# Patient Record
Sex: Female | Born: 1990 | Race: White | Hispanic: No | Marital: Single | Smoking: Never smoker
Health system: Southern US, Community
[De-identification: ages and names within clinical notes are randomized; demographics above are authoritative.]

## PROBLEM LIST (undated history)

## (undated) DIAGNOSIS — S022XXA Fracture of nasal bones, initial encounter for closed fracture: Secondary | ICD-10-CM

## (undated) DIAGNOSIS — J309 Allergic rhinitis, unspecified: Secondary | ICD-10-CM

## (undated) DIAGNOSIS — L709 Acne, unspecified: Secondary | ICD-10-CM

## (undated) DIAGNOSIS — E282 Polycystic ovarian syndrome: Secondary | ICD-10-CM

## (undated) DIAGNOSIS — R519 Headache, unspecified: Secondary | ICD-10-CM

## (undated) DIAGNOSIS — H40009 Preglaucoma, unspecified, unspecified eye: Secondary | ICD-10-CM

## (undated) DIAGNOSIS — H40003 Preglaucoma, unspecified, bilateral: Secondary | ICD-10-CM

## (undated) DIAGNOSIS — R51 Headache: Secondary | ICD-10-CM

## (undated) DIAGNOSIS — J342 Deviated nasal septum: Secondary | ICD-10-CM

## (undated) HISTORY — DX: Headache: R51

## (undated) HISTORY — DX: Allergic rhinitis, unspecified: J30.9

## (undated) HISTORY — DX: Preglaucoma, unspecified, bilateral: H40.003

## (undated) HISTORY — DX: Preglaucoma, unspecified, unspecified eye: H40.009

## (undated) HISTORY — DX: Acne, unspecified: L70.9

## (undated) HISTORY — DX: Polycystic ovarian syndrome: E28.2

## (undated) HISTORY — DX: Deviated nasal septum: J34.2

## (undated) HISTORY — DX: Fracture of nasal bones, initial encounter for closed fracture: S02.2XXA

## (undated) HISTORY — DX: Headache, unspecified: R51.9

---
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
## 2013-02-03 ENCOUNTER — Ambulatory Visit (INDEPENDENT_AMBULATORY_CARE_PROVIDER_SITE_OTHER): Payer: PRIVATE HEALTH INSURANCE | Admitting: Pediatrics

## 2013-02-03 VITALS — Wt 163.9 lb

## 2013-02-03 DIAGNOSIS — R51 Headache: Secondary | ICD-10-CM

## 2013-02-03 DIAGNOSIS — J309 Allergic rhinitis, unspecified: Secondary | ICD-10-CM | POA: Insufficient documentation

## 2013-02-03 DIAGNOSIS — H6593 Unspecified nonsuppurative otitis media, bilateral: Secondary | ICD-10-CM

## 2013-02-03 DIAGNOSIS — H6590 Unspecified nonsuppurative otitis media, unspecified ear: Secondary | ICD-10-CM | POA: Insufficient documentation

## 2013-02-03 MED ORDER — IBUPROFEN 200 MG PO TABS: 600.0000 mg | ORAL_TABLET | Freq: Three times a day (TID) | ORAL | Status: DC | PRN

## 2013-02-03 MED ORDER — CETIRIZINE HCL 10 MG PO TABS: 10.0000 mg | ORAL_TABLET | Freq: Every day | ORAL | Status: DC

## 2013-02-03 MED ORDER — PSEUDOEPHEDRINE-GUAIFENESIN ER 120-1200 MG PO TB12: 1.0000 | ORAL_TABLET | ORAL | Status: AC

## 2013-02-03 MED ORDER — FLUTICASONE PROPIONATE 50 MCG/ACT NA SUSP: NASAL | Status: DC

## 2013-02-03 NOTE — Progress Notes (Signed)
Subjective:    History was provided by the patient. Karen Lang is a 22 y.o. female who presents for evaluation of sore throat. Symptoms began 1 week ago. Pain is mild-moderate and localized. Fever is absent. Other associated symptoms have included nasal congestion, post-nasal drainage & throat clearing. Fluid intake is good. There has not been contact with an individual with known strep. Current medications include Sudafed, Zyrtec.   The following portions of the patient's history were reviewed and updated as appropriate: allergies and current medications.   Pertinent PMH Allergic rhinitis  Review of Systems  General: negative for fevers or change in activity level ENT: negative for earaches but + fullness in both ears  GI: negative for abd pain, constipation, diarrhea and vomiting.  Derm: no rashes   Objective:   Wt 163 lb 14.4 oz (74.345 kg)  BMI 26.85 kg/m2  General:  alert and cooperative, no distress   HEENT:  Normocephalic Sclera/conjunctiva clear bilaterally, no drainage Right & Left TMs pearly gray without infection, but + mucoid fluid Nasal mucosa pink, turbinates very swollen Moist, pink oral mucus membranes;  Pharynx erythematous without exudate or lesions;  Tonsils 1+, no exudate  Neck:   supple, symmetrical, trachea midline  mild anterior cervical adenopathy  Lungs:  clear to auscultation bilaterally   Heart:  regular rate and rhythm, S1, S2 normal, no murmur, click, rub or gallop   Abdomen:  soft, non-tender, non-distended, active bowel sounds, no masses   Skin:  reveals no rash     Assessment:    Allergic rhinitis Sinus headache Bilateral OME Pharyngitis, secondary to post-nasal drainage.   Plan:    Discussed pathophysiology of allergies, headaches and mucoid OME. Discussed treatment, expected course of illness and the importance of avoiding unnecessary antibiotics. Supportive care: OTC analgesics, salt water gargles.  Saline nasal spray for nasal  congestion PRN Rx: Flonase BID x2 days, then QHS;  Mucinex D QAM x3 days; Zyrtec 10mg  daily 600mg  Ibuprofen Q8hr PRN Follow up as needed.  Will call pt if DNA probe +.

## 2013-02-03 NOTE — Patient Instructions (Signed)
Follow-up if symptoms worsen or don't improve in 2 days.  Headache and Allergies The relationship between allergies and headaches is unclear. Many people with allergic or infectious nasal problems also have headaches (migraines or sinus headaches). However, sometimes allergies can cause pressure that feels like a headache, and sometimes headaches can cause allergy-like symptoms. It is not always clear whether your symptoms are caused by allergies or by a headache. CAUSES   Migraine: The cause of a migraine is not always known.  Sinus Headache: The cause of a sinus headache may be a sinus infection. Other conditions that may be related to sinus headaches include:  Hay fever (allergic rhinitis).  Deviation of the nasal septum.  Swelling or clogging of the nasal passages. SYMPTOMS  Migraine headache symptoms (which often last 4 to 72 hours) include:  Intense, throbbing pain on one or both sides of the head.  Nausea.  Vomiting.  Being extra sensitive to light.  Being extra sensitive to sound.  Nervous system reactions that appear similar to an allergic reaction:  Stuffy nose.  Runny nose.  Tearing. Sinus headaches are felt as facial pain or pressure.  DIAGNOSIS  Because there is some overlap in symptoms, sinus and migraine headaches are often misdiagnosed. For example, a person with migraines may also feel facial pressure. Likewise, many people with hay fever may get migraine headaches rather than sinus headaches. These migraines can be triggered by the histamine release during an allergic reaction. An antihistamine medicine can eliminate this pain. There are standard criteria that help clarify the difference between these headaches and related allergy or allergy-like symptoms. Your caregiver can use these criteria to determine the proper diagnosis and provide you the best care. TREATMENT  Migraine medicine may help people who have persistent migraine headaches even though their  hay fever is controlled. For some people, anti-inflammatory treatments do not work to relieve migraines. Medicines called triptans (such as sumatriptan) can be helpful for those people. Document Released: 11/25/2003 Document Revised: 11/27/2011 Document Reviewed: 12/17/2009 [employer] Patient Information 2013 [place], [place].  Allergic Rhinitis Allergic rhinitis is when the mucous membranes in the nose respond to allergens. Allergens are particles in the air that cause your body to have an allergic reaction. This causes you to release allergic antibodies. Through a chain of events, these eventually cause you to release histamine into the blood stream (hence the use of antihistamines). Although meant to be protective to the body, it is this release that causes your discomfort, such as frequent sneezing, congestion and an itchy runny nose.  CAUSES  The pollen allergens may come from grasses, trees, and weeds. This is seasonal allergic rhinitis, or "hay fever." Other allergens cause year-round allergic rhinitis (perennial allergic rhinitis) such as house dust mite allergen, pet dander and mold spores.  SYMPTOMS   Nasal stuffiness (congestion).  Runny, itchy nose with sneezing and tearing of the eyes.  There is often an itching of the mouth, eyes and ears. It cannot be cured, but it can be controlled with medications. DIAGNOSIS  If you are unable to determine the offending allergen, skin or blood testing may find it. TREATMENT   Avoid the allergen.  Medications and allergy shots (immunotherapy) can help.  Hay fever may often be treated with antihistamines in pill or nasal spray forms. Antihistamines block the effects of histamine. There are over-the-counter medicines that may help with nasal congestion and swelling around the eyes. Check with your caregiver before taking or giving this medicine. If the treatment above  does not work, there are many new medications your caregiver can prescribe.  Stronger medications may be used if initial measures are ineffective. Desensitizing injections can be used if medications and avoidance fails. Desensitization is when a patient is given ongoing shots until the body becomes less sensitive to the allergen. Make sure you follow up with your caregiver if problems continue. SEEK MEDICAL CARE IF:   You develop fever (more than 100.5 F (38.1 C).  You develop a cough that does not stop easily (persistent).  You have shortness of breath.  You start wheezing.  Symptoms interfere with normal daily activities. Document Released: 05/30/2001 Document Revised: 11/27/2011 Document Reviewed: 12/09/2008 [facility] Patient Information 2013 [place], [place].

## 2013-02-04 ENCOUNTER — Telehealth: Payer: Self-pay | Admitting: Pediatrics

## 2013-02-04 MED ORDER — AMOXICILLIN 500 MG PO CAPS: 500.0000 mg | ORAL_CAPSULE | Freq: Two times a day (BID) | ORAL | Status: DC

## 2013-02-04 NOTE — Telephone Encounter (Signed)
Sister states Karen Lang is not improving,only got 4 hrs sleep last pm,pain has increased.Erin told them to call back today if not better because they are going out of town early tomorrow

## 2013-02-04 NOTE — Telephone Encounter (Signed)
Spoke to sister--advised to start benadryl and will start on amoxil

## 2013-02-12 ENCOUNTER — Encounter: Payer: Self-pay | Admitting: Internal Medicine

## 2013-02-12 ENCOUNTER — Ambulatory Visit (INDEPENDENT_AMBULATORY_CARE_PROVIDER_SITE_OTHER): Payer: Commercial Managed Care - PPO | Admitting: Internal Medicine

## 2013-02-12 VITALS — BP 118/82 | HR 86 | Temp 98.1°F | Ht 66.0 in | Wt 163.0 lb

## 2013-02-12 DIAGNOSIS — D649 Anemia, unspecified: Secondary | ICD-10-CM

## 2013-02-12 DIAGNOSIS — Z111 Encounter for screening for respiratory tuberculosis: Secondary | ICD-10-CM

## 2013-02-12 DIAGNOSIS — Z8349 Family history of other endocrine, nutritional and metabolic diseases: Secondary | ICD-10-CM

## 2013-02-12 DIAGNOSIS — Z68.41 Body mass index (BMI) pediatric, 85th percentile to less than 95th percentile for age: Secondary | ICD-10-CM

## 2013-02-12 LAB — FERRITIN: Ferritin: 5 ng/mL — ABNORMAL LOW (ref 10.0–291.0)

## 2013-02-12 LAB — CBC WITH DIFFERENTIAL/PLATELET
Basophils Absolute: 0 10*3/uL (ref 0.0–0.1)
Eosinophils Absolute: 0.2 10*3/uL (ref 0.0–0.7)
HCT: 42.7 % (ref 36.0–46.0)
Lymphs Abs: 1.7 10*3/uL (ref 0.7–4.0)
Monocytes Absolute: 0.7 10*3/uL (ref 0.1–1.0)
Monocytes Relative: 7.1 % (ref 3.0–12.0)
Platelets: 293 10*3/uL (ref 150.0–400.0)
RDW: 15.1 % — ABNORMAL HIGH (ref 11.5–14.6)

## 2013-02-12 LAB — VITAMIN B12: Vitamin B-12: 1117 pg/mL — ABNORMAL HIGH (ref 211–911)

## 2013-02-12 NOTE — Progress Notes (Signed)
  Subjective:    Patient ID: Karen Lang, female    DOB: 1991-05-10, 22 y.o.   MRN: [id number]  HPI New patient, here to get established. Needs a PPD placed. Likes a screening for diabetes. Complains of anemia, was told her hemoglobin was 9 g when she went to donate blood, that happened at least 3 times before. Has a knot at the right leg for a year. Denies redness, discharge or other subcutaneous lesions.  Past Medical History  Diagnosis Date  . Acne    Past Surgical History  Procedure Laterality Date  . No past surgeries     History   Social History  . Marital Status: Single    Spouse Name: N/A    Number of Children: 0  . Years of Education: N/A   Occupational History  . student, [employer]    Social History Main Topics  . Smoking status: Never Smoker   . Smokeless tobacco: Never Used  . Alcohol Use: No  . Drug Use: No  . Sexually Active: Not on file   Other Topics Concern  . Not on file   Social History Narrative   Mother is Mrs Toniann Fail   Family History  Problem Relation Age of Onset  . Breast cancer Other     GM  . Colon cancer Neg Hx   . Diabetes Neg Hx   . CAD Father     stents  . Hypertension Neg Hx   . Thyroid disease Father      thyroid tumor    Review of Systems Denies abdominal pain, nausea, vomiting, diarrhea or blood in the stools. Periods are monthly, lasts 4-5 days, then daily usually between 1 and 3 days. She's not on BCP.     Objective:   Physical Exam  Skin:      BP 118/82  Pulse 86  Temp(Src) 98.1 F (36.7 C) (Oral)  Ht 5\' 6"  (1.676 m)  Wt 163 lb (73.936 kg)  BMI 26.32 kg/m2  SpO2 99%  General -- alert, well-developed, NAD  Neck --no thyromegaly Lungs -- normal respiratory effort, no intercostal retractions, no accessory muscle use, and normal breath sounds.   Heart-- normal rate, regular rhythm, no murmur, and no gallop.   Abdomen--soft, non-tender, no distention, no masses  Extremities-- no pretibial edema  bilaterally Psych-- Cognition and judgment appear intact. Alert and cooperative with normal attention span and concentration.  not anxious appearing and not depressed appearing.       Assessment & Plan:   PPD placed, return to the office in 2 days. Request a screening for diabetes. Labs. Subcutaneous induration, recommend observation, on exam there is no phlebitis or a hard tumor. Will call if the area increase in size, change or become heart.

## 2013-02-12 NOTE — Assessment & Plan Note (Addendum)
Has been told her hemoglobin has been as low as 9 grms. GI review of systems negative. In this young female the most likely etiology of anemia is  excessive vaginal bleeding with periods. Plan: Labs

## 2013-02-12 NOTE — Patient Instructions (Addendum)
Please come back Friday to read the PPD

## 2013-02-13 ENCOUNTER — Encounter: Payer: Self-pay | Admitting: Internal Medicine

## 2013-02-13 ENCOUNTER — Encounter: Payer: Self-pay | Admitting: *Deleted

## 2013-02-13 NOTE — Assessment & Plan Note (Signed)
Check a TSH Also likes a screening for DM, check a CBGF

## 2013-02-14 ENCOUNTER — Ambulatory Visit: Payer: Commercial Managed Care - PPO

## 2013-02-14 ENCOUNTER — Encounter: Payer: Self-pay | Admitting: *Deleted

## 2013-02-14 LAB — TB SKIN TEST: TB Skin Test: NEGATIVE

## 2014-03-18 ENCOUNTER — Telehealth: Payer: Self-pay | Admitting: *Deleted

## 2014-03-18 NOTE — Telephone Encounter (Signed)
Caller name:  Toniann FailWendy Relation to pt:  mother Call back number: [phone]  Pharmacy:  Reason for call:   Pt mother, Toniann FailWendy, called.  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    Also, pt mother, Toniann FailWendy, would like you to review pt immunization record to see if there are any vaccinations due at this time.  Please advise on both to mother, Toniann FailWendy at above number.

## 2014-03-18 NOTE — Telephone Encounter (Signed)
Pt scheduled 03/23/14 for tb and Benedict Needymenactra

## 2014-03-23 ENCOUNTER — Ambulatory Visit (INDEPENDENT_AMBULATORY_CARE_PROVIDER_SITE_OTHER): Payer: 59 | Admitting: *Deleted

## 2014-03-23 DIAGNOSIS — Z23 Encounter for immunization: Secondary | ICD-10-CM

## 2014-03-23 DIAGNOSIS — Z719 Counseling, unspecified: Secondary | ICD-10-CM

## 2014-03-24 ENCOUNTER — Ambulatory Visit: Payer: Commercial Managed Care - PPO

## 2014-03-25 LAB — TB SKIN TEST
Induration: 0 mm
TB SKIN TEST: NEGATIVE

## 2014-04-23 ENCOUNTER — Encounter: Payer: Self-pay | Admitting: Internal Medicine

## 2014-04-23 ENCOUNTER — Ambulatory Visit (INDEPENDENT_AMBULATORY_CARE_PROVIDER_SITE_OTHER): Payer: 59 | Admitting: Internal Medicine

## 2014-04-23 VITALS — BP 128/85 | HR 80 | Temp 98.5°F | Ht 66.0 in | Wt 182.1 lb

## 2014-04-23 DIAGNOSIS — D649 Anemia, unspecified: Secondary | ICD-10-CM | POA: Diagnosis not present

## 2014-04-23 DIAGNOSIS — Z0000 Encounter for general adult medical examination without abnormal findings: Secondary | ICD-10-CM

## 2014-04-23 DIAGNOSIS — Z23 Encounter for immunization: Secondary | ICD-10-CM

## 2014-04-23 LAB — CBC WITH DIFFERENTIAL/PLATELET
BASOS PCT: 0.3 % (ref 0.0–3.0)
Basophils Absolute: 0 10*3/uL (ref 0.0–0.1)
Eosinophils Absolute: 0.2 10*3/uL (ref 0.0–0.7)
Eosinophils Relative: 2.1 % (ref 0.0–5.0)
HCT: 35.8 % — ABNORMAL LOW (ref 36.0–46.0)
Hemoglobin: 11.1 g/dL — ABNORMAL LOW (ref 12.0–15.0)
LYMPHS PCT: 27.4 % (ref 12.0–46.0)
Lymphs Abs: 2.1 10*3/uL (ref 0.7–4.0)
MCHC: 31 g/dL (ref 30.0–36.0)
MONO ABS: 0.5 10*3/uL (ref 0.1–1.0)
Monocytes Relative: 5.9 % (ref 3.0–12.0)
Neutro Abs: 5 10*3/uL (ref 1.4–7.7)
Neutrophils Relative %: 64.3 % (ref 43.0–77.0)
Platelets: 301 10*3/uL (ref 150.0–400.0)
RBC: 5.16 Mil/uL — AB (ref 3.87–5.11)
RDW: 16.8 % — ABNORMAL HIGH (ref 11.5–14.6)
WBC: 7.8 10*3/uL (ref 4.5–10.5)

## 2014-04-23 LAB — BASIC METABOLIC PANEL
BUN: 9 mg/dL (ref 6–23)
CALCIUM: 9.6 mg/dL (ref 8.4–10.5)
CO2: 27 mEq/L (ref 19–32)
CREATININE: 0.7 mg/dL (ref 0.4–1.2)
Chloride: 101 mEq/L (ref 96–112)
GFR: 110.62 mL/min (ref >60.00)
GLUCOSE: 64 mg/dL — AB (ref 70–99)
Potassium: 3.4 mEq/L — ABNORMAL LOW (ref 3.5–5.1)
Sodium: 136 mEq/L (ref 135–145)

## 2014-04-23 LAB — CHOLESTEROL, TOTAL: Cholesterol: 133 mg/dL (ref 0–200)

## 2014-04-23 LAB — TSH: TSH: 1.94 u[IU]/mL (ref 0.35–5.50)

## 2014-04-23 NOTE — Assessment & Plan Note (Signed)
Td today Recommend to start Pap smears next year, will be referred to gynecology at that time Diet and exercise discussed, he does have plans to do better with her lifestyle. Not sexually active but still  Discussed  safe sex, also discussed safe driving. Labs Return to the office 1 year

## 2014-04-23 NOTE — Progress Notes (Signed)
   Subjective:    Patient ID: Karen Lang, female    DOB: 08/31/1991, 23 y.o.   MRN: [id number]  DOS:  04/23/2014 Type of visit - description: CPX Feeling great  ROS Diet--  trying to do well  Exercise-- when at college usually active No  CP, SOB, syncope  Denies  nausea, vomiting diarrhea, blood in the stools Allergies ok (-) cough, sputum production No dysuria, gross hematuria, difficulty urinating  No vaginal discharge, genital rash  Periods are normal, regular , heavy x 2-3 days No anxiety, depression    Past Medical History  Diagnosis Date  . Acne     Past Surgical History  Procedure Laterality Date  . No past surgeries      History   Social History  . Marital Status: Single    Spouse Name: N/A    Number of Children: 0  . Years of Education: N/A   Occupational History  . student, [employer]    Social History Main Topics  . Smoking status: Never Smoker   . Smokeless tobacco: Never Used  . Alcohol Use: No  . Drug Use: No  . Sexual Activity: Not on file   Other Topics Concern  . Not on file   Social History Narrative   Mother is Karen Lang     Family History  Problem Relation Age of Onset  . Breast cancer Other     GM  . Colon cancer Neg Hx   . Diabetes Neg Hx   . CAD Father     stents  . Hypertension Neg Hx   . Thyroid disease Father      thyroid tumor       Medication List       This list is accurate as of: 04/23/14  5:57 PM.  Always use your most recent med list.               cetirizine 10 MG tablet  Commonly known as:  ZYRTEC  Take 1 tablet (10 mg total) by mouth daily.     fluticasone 50 MCG/ACT nasal spray  Commonly known as:  FLONASE  2 sprays per nostril once daily at bedtime. Use an additional dose in the morning for the next 3 days.     ibuprofen 200 MG tablet  Commonly known as:  ADVIL  Take 3 tablets (600 mg total) by mouth every 8 (eight) hours as needed for headache.           Objective:   Physical  Exam BP 128/85  Pulse 80  Temp(Src) 98.5 F (36.9 C) (Oral)  Ht 5\' 6"  (1.676 m)  Wt 182 lb 2 oz (82.611 kg)  BMI 29.41 kg/m2  SpO2 94%  LMP 04/08/2014 General -- alert, well-developed, NAD.  Neck --no thyromegaly  HEENT-- Not pale.   Lungs -- normal respiratory effort, no intercostal retractions, no accessory muscle use, and normal breath sounds.  Heart-- normal rate, regular rhythm, no murmur.  Abdomen-- Not distended, good bowel sounds,soft, non-tender.  Extremities-- no pretibial edema bilaterally  Neurologic--  alert & oriented X3. Speech normal, gait appropriate for age, strength symmetric and appropriate for age.  Psych-- Cognition and judgment appear intact. Cooperative with normal attention span and concentration. No anxious or depressed appearing.     Assessment & Plan:

## 2014-04-23 NOTE — Patient Instructions (Signed)
Get your blood work before you leave   Next visit is for a physical exam in 1 year, fasting Please make an appointment    

## 2014-04-24 ENCOUNTER — Ambulatory Visit: Payer: 59

## 2014-04-24 DIAGNOSIS — D649 Anemia, unspecified: Secondary | ICD-10-CM

## 2014-04-24 LAB — FERRITIN: FERRITIN: 2.8 ng/mL — AB (ref 10.0–291.0)

## 2014-04-24 LAB — IRON: IRON: 21 ug/dL — AB (ref 42–145)

## 2014-04-24 NOTE — Addendum Note (Signed)
Addended by: Wende MottFOSTER, Anjelo Pullman H on: 04/24/2014 10:37 AM   Modules accepted: Orders

## 2014-04-27 ENCOUNTER — Telehealth: Payer: Self-pay

## 2014-04-27 DIAGNOSIS — D508 Other iron deficiency anemias: Secondary | ICD-10-CM

## 2014-04-27 MED ORDER — FERROUS SULFATE 325 (65 FE) MG PO TABS: 325.0000 mg | ORAL_TABLET | Freq: Two times a day (BID) | ORAL | Status: DC

## 2014-04-27 NOTE — Telephone Encounter (Signed)
Message copied by Wende MottFOSTER, Jenisha Faison H on Mon Apr 27, 2014  2:29 PM ------      Message from: Wanda PlumpZ, JOSE E      Created: Sat Apr 25, 2014  7:57 PM       Patient, all labs okay except for anemia and low iron most likely due to heavy periods.      Call  FeSO4 325 mg 1 tab twce a day #60, 6 Rf      One multivitamin OTC daily      RTC 6 months to recheck her labs ------

## 2014-04-27 NOTE — Telephone Encounter (Signed)
Advised per results. Sent Rx to [facility] [address] [place]

## 2014-09-09 ENCOUNTER — Encounter: Payer: Self-pay | Admitting: Internal Medicine

## 2014-09-09 ENCOUNTER — Ambulatory Visit (INDEPENDENT_AMBULATORY_CARE_PROVIDER_SITE_OTHER): Payer: Commercial Managed Care - PPO | Admitting: Internal Medicine

## 2014-09-09 VITALS — BP 125/85 | HR 101 | Temp 98.2°F | Wt 179.5 lb

## 2014-09-09 DIAGNOSIS — D500 Iron deficiency anemia secondary to blood loss (chronic): Secondary | ICD-10-CM

## 2014-09-09 DIAGNOSIS — R002 Palpitations: Secondary | ICD-10-CM

## 2014-09-09 LAB — VITAMIN B12: VITAMIN B 12: 291 pg/mL (ref 211–911)

## 2014-09-09 LAB — FOLATE: FOLATE: 14.7 ng/mL (ref >5.9)

## 2014-09-09 LAB — FERRITIN: FERRITIN: 13.3 ng/mL (ref 10.0–291.0)

## 2014-09-09 LAB — IRON: Iron: 73 ug/dL (ref 42–145)

## 2014-09-09 LAB — HEMOGLOBIN: HEMOGLOBIN: 15 g/dL (ref 12.0–15.0)

## 2014-09-09 NOTE — Assessment & Plan Note (Signed)
Patient has iron deficiency anemia, on iron supplements. Periods are heavy the first 2 days of every month, no GI symptoms Plan: Labs, continue iron supplements.

## 2014-09-09 NOTE — Progress Notes (Signed)
Pre visit review using our clinic review tool, if applicable. No additional management support is needed unless otherwise documented below in the visit note. 

## 2014-09-09 NOTE — Progress Notes (Signed)
Subjective:    Patient ID: Karen Lang, female    DOB: 03/31/1991, 23 y.o.   MRN: [id number]  DOS:  09/09/2014 Type of visit - description : Acute visit, here with her mother  Interval history: 2 months history of palpitations, described as her heart skipping, not going fast. Symptoms last 3-5 seconds, at rest, she doesn't exercise  but is able to do all her ADLs without symptoms. When the problem happens  "it takes the  breath away" and sometimes has anterior chest pain that last few seconds as well. No syncope, headache, diaphoresis. Symptoms happen randomly sometimes once a day sometimes several times a day.  History of anemia, on iron.   ROS Denies nausea, vomiting, diarrhea or blood in the stools. No abdominal pain. Some stress due to college, not anxious per se Not taking excessive caffeine Periods are regular, monthly, last 4-5 days, they are moderately heavy the first 2 days. Denies taking any OTCs, herbal supplements, etc.  Past Medical History  Diagnosis Date  . Acne     Past Surgical History  Procedure Laterality Date  . No past surgeries      History   Social History  . Marital Status: Single    Spouse Name: N/A    Number of Children: 0  . Years of Education: N/A   Occupational History  . student, [employer]    Social History Main Topics  . Smoking status: Never Smoker   . Smokeless tobacco: Never Used  . Alcohol Use: No  . Drug Use: No  . Sexual Activity: Not on file   Other Topics Concern  . Not on file   Social History Narrative   Mother is [name]        Medication List       This list is accurate as of: 09/09/14  6:23 PM.  Always use your most recent med list.               cetirizine 10 MG tablet  Commonly known as:  ZYRTEC  Take 1 tablet (10 mg total) by mouth daily.     ferrous sulfate 325 (65 FE) MG tablet  Take 1 tablet (325 mg total) by mouth 2 (two) times daily.     fluticasone 50 MCG/ACT nasal spray    Commonly known as:  FLONASE  2 sprays per nostril once daily at bedtime. Use an additional dose in the morning for the next 3 days.     ibuprofen 200 MG tablet  Commonly known as:  ADVIL  Take 3 tablets (600 mg total) by mouth every 8 (eight) hours as needed for headache.           Objective:   Physical Exam BP 125/85 mmHg  Pulse 101  Temp(Src) 98.2 F (36.8 C) (Oral)  Wt 179 lb 8 oz (81.421 kg)  SpO2 97%  LMP 08/19/2014 General -- alert, well-developed, NAD.  Neck --no thyromegaly   HEENT-- Not pale.  Lungs -- normal respiratory effort, no intercostal retractions, no accessory muscle use, and normal breath sounds.  Heart-- slt tachy, regular rhythm, no murmur.  Abdomen-- Not distended, good bowel sounds,soft, non-tender. Extremities-- no pretibial edema bilaterally  Neurologic--  alert & oriented X3. Speech normal, gait appropriate for age, strength symmetric and appropriate for age.  Psych-- Cognition and judgment appear intact. Cooperative with normal attention span and concentration. No anxious or depressed appearing.     Assessment & Plan:

## 2014-09-09 NOTE — Patient Instructions (Signed)
Get your blood work before you leave    Continue iron supplements daily and a multivitamin  Please come back to the office in 3 months  for a routine check up

## 2014-09-09 NOTE — Assessment & Plan Note (Addendum)
2 months history of palpitations without red flag symptoms, recent TSH normal, she has anemia, where monitoring that problem today. EKG today normal sinus, rate 77. We took about possible further workup versus observations since there is no red flag symptoms. Elected observation. Plan: Will call in 2 or 3 weeks if symptoms persisting, call anytime if symptoms severe and will proceed with further eval (? echo and a Holter monitor)

## 2014-09-28 ENCOUNTER — Telehealth: Payer: Self-pay | Admitting: Internal Medicine

## 2014-09-28 DIAGNOSIS — R002 Palpitations: Secondary | ICD-10-CM

## 2014-09-28 DIAGNOSIS — R079 Chest pain, unspecified: Secondary | ICD-10-CM

## 2014-09-28 NOTE — Telephone Encounter (Signed)
Last time, she complained of palpitations, -- if the symptoms are the same please arrange echocardiogram and Holter monitor for 48 hours. DX palpitations --If the symptoms are different, please ask patient to come for office visit

## 2014-09-28 NOTE — Telephone Encounter (Signed)
Please advise 

## 2014-09-28 NOTE — Telephone Encounter (Signed)
thx

## 2014-09-28 NOTE — Telephone Encounter (Signed)
Patient following up with symptoms from last OV 09/09/2014. Pt states it feels like nerve is pinching her heart. In need of clinical advice.

## 2014-09-28 NOTE — Telephone Encounter (Signed)
Spoke with Pt, she informed me she is still having palpitations but sometimes accompanied with a pain in her chest. Pt would like to continue with echocardiogram and 48 hour holter monitor. Pt will be returning to school out of town on Saturday so she is requesting these to be done before then. Informed her we would try our best, Pt verbalized understanding. Holter monitor-48 hours, and echocardiogram ordered.

## 2014-09-30 ENCOUNTER — Encounter (INDEPENDENT_AMBULATORY_CARE_PROVIDER_SITE_OTHER): Payer: Commercial Managed Care - PPO

## 2014-09-30 ENCOUNTER — Encounter: Payer: Self-pay | Admitting: Radiology

## 2014-09-30 ENCOUNTER — Other Ambulatory Visit (HOSPITAL_COMMUNITY): Payer: Self-pay | Admitting: Internal Medicine

## 2014-09-30 ENCOUNTER — Ambulatory Visit (HOSPITAL_BASED_OUTPATIENT_CLINIC_OR_DEPARTMENT_OTHER)
Admission: RE | Admit: 2014-09-30 | Discharge: 2014-09-30 | Disposition: A | Discharge status: Home / Self Care | Payer: Commercial Managed Care - PPO | Source: Ambulatory Visit | Attending: Internal Medicine | Admitting: Internal Medicine

## 2014-09-30 DIAGNOSIS — R002 Palpitations: Secondary | ICD-10-CM | POA: Diagnosis present

## 2014-09-30 DIAGNOSIS — R072 Precordial pain: Secondary | ICD-10-CM

## 2014-09-30 DIAGNOSIS — Z8249 Family history of ischemic heart disease and other diseases of the circulatory system: Secondary | ICD-10-CM | POA: Insufficient documentation

## 2014-09-30 DIAGNOSIS — R079 Chest pain, unspecified: Secondary | ICD-10-CM

## 2014-09-30 NOTE — Progress Notes (Signed)
*  PRELIMINARY RESULTS* Echocardiogram 2D Echocardiogram has been performed.  Karen Lang, [name] 09/30/2014, 10:46 AM

## 2014-09-30 NOTE — Progress Notes (Signed)
Patient ID: [name], female   DOB: 06/17/1991, 24 y.o.   MRN: [id number] [facility] 48hr holter applied.

## 2014-10-14 ENCOUNTER — Telehealth: Payer: Self-pay | Admitting: Internal Medicine

## 2014-10-14 NOTE — Telephone Encounter (Signed)
Returned patient call regarding lab results.  Notified patient of results.  Letter previously mailed by Vicente MalesKaylyn.  eal

## 2014-10-14 NOTE — Telephone Encounter (Signed)
PATIENT SAYS SOMEONE NAMED ASHLEE CALLED HER ABOUT HER TEST RESULTS  PLEASE CALL HER AGAIN

## 2014-10-14 NOTE — Telephone Encounter (Signed)
I have not called patient.  Karen Lang, by chance, have you?

## 2015-02-09 ENCOUNTER — Ambulatory Visit (INDEPENDENT_AMBULATORY_CARE_PROVIDER_SITE_OTHER): Payer: Commercial Managed Care - PPO | Admitting: *Deleted

## 2015-02-09 DIAGNOSIS — Z111 Encounter for screening for respiratory tuberculosis: Secondary | ICD-10-CM | POA: Diagnosis not present

## 2015-02-09 NOTE — Progress Notes (Signed)
Pre visit review using our clinic review tool, if applicable. No additional management support is needed unless otherwise documented below in the visit note.  Patient tolerated injection well.  Notified patient to return within 24-48 hours for reading.

## 2015-02-11 ENCOUNTER — Telehealth: Payer: Self-pay | Admitting: *Deleted

## 2015-02-11 LAB — TB SKIN TEST
Induration: 0 mm
TB Skin Test: NEGATIVE

## 2015-02-11 NOTE — Telephone Encounter (Signed)
PPD results negative.  Letter printed and given to patient.

## 2015-04-06 ENCOUNTER — Telehealth: Payer: Self-pay | Admitting: Internal Medicine

## 2015-04-06 NOTE — Telephone Encounter (Signed)
pre visit letter mailed 04/05/15 °

## 2015-04-23 ENCOUNTER — Telehealth: Payer: Self-pay | Admitting: *Deleted

## 2015-04-23 NOTE — Telephone Encounter (Signed)
Unable to reach patient at time of Pre-Visit Call.  Left message for patient to return call when available.    

## 2015-04-26 ENCOUNTER — Encounter: Payer: Self-pay | Admitting: Internal Medicine

## 2015-04-26 ENCOUNTER — Ambulatory Visit (INDEPENDENT_AMBULATORY_CARE_PROVIDER_SITE_OTHER): Payer: Commercial Managed Care - PPO | Admitting: Internal Medicine

## 2015-04-26 VITALS — BP 116/76 | HR 80 | Temp 98.4°F | Ht 66.0 in | Wt 200.0 lb

## 2015-04-26 DIAGNOSIS — Z0000 Encounter for general adult medical examination without abnormal findings: Secondary | ICD-10-CM | POA: Diagnosis not present

## 2015-04-26 NOTE — Progress Notes (Signed)
Subjective:    Patient ID: Karen Lang, female    DOB: 03/20/1991, 24 y.o.   MRN: [id number]  DOS:  04/26/2015 Type of visit - description : cpx Interval history: No concerns. History of anemia, on iron and a multivitamin. She has a period monthly, heavy for 3 days, light for 2 days, in general they have been less heavy lately.   Review of Systems Constitutional: No fever. No chills. No unexplained wt changes. No unusual sweats  HEENT: No dental problems, no ear discharge, no facial swelling, no voice changes. No eye discharge, no eye  redness , no  intolerance to light   Respiratory: No wheezing , no  difficulty breathing. No cough , no mucus production  Cardiovascular: No CP, no leg swelling , no  Palpitations  GI: no nausea, no vomiting, no diarrhea , no  abdominal pain.  No blood in the stools. No dysphagia, no odynophagia    Endocrine: No polyphagia, no polyuria , no polydipsia  GU: No dysuria, gross hematuria, difficulty urinating. No urinary urgency, no frequency.  Musculoskeletal: No joint swellings or unusual aches or pains  Skin: No change in the color of the skin, palor , no  Rash  Allergic, immunologic: No environmental allergies , no  food allergies  Neurological: No dizziness no  syncope. No headaches. No diplopia, no slurred, no slurred speech, no motor deficits, no facial  Numbness  Hematological: No enlarged lymph nodes, no easy bruising , no unusual bleedings  Psychiatry: No suicidal ideas, no hallucinations, no beavior problems, no confusion.  No unusual/severe anxiety, no depression   Past Medical History  Diagnosis Date  . Acne     Past Surgical History  Procedure Laterality Date  . No past surgeries      History   Social History  . Marital Status: Single    Spouse Name: N/A  . Number of Children: 0  . Years of Education: N/A   Occupational History  . student, [employer]    Social History Main Topics  . Smoking status:  Never Smoker   . Smokeless tobacco: Never Used  . Alcohol Use: No  . Drug Use: No  . Sexual Activity: Not on file   Other Topics Concern  . 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 Breast cancer Other     GM  . Colon cancer Neg Hx   . Diabetes Neg Hx   . CAD Father     stents  . Hypertension Neg Hx   . Thyroid disease Father      thyroid tumor      Medication List       This list is accurate as of: 04/26/15 11:59 PM.  Always use your most recent med list.               cetirizine 10 MG tablet  Commonly known as:  ZYRTEC  Take 1 tablet (10 mg total) by mouth daily.     ferrous sulfate 325 (65 FE) MG tablet  Take 1 tablet (325 mg total) by mouth 2 (two) times daily.     fluticasone 50 MCG/ACT nasal spray  Commonly known as:  FLONASE  2 sprays per nostril once daily at bedtime. Use an additional dose in the morning for the next 3 days.  ibuprofen 200 MG tablet  Commonly known as:  ADVIL  Take 3 tablets (600 mg total) by mouth every 8 (eight) hours as needed for headache.           Objective:   Physical Exam BP 116/76 mmHg  Pulse 80  Temp(Src) 98.4 F (36.9 C) (Oral)  Ht  (1.676 m)  Wt 200 lb (90.719 kg)  BMI 32.30 kg/m2  SpO2 99%  LMP 04/05/2015 (Approximate)  General:   Well developed, well nourished . NAD.  HEENT:  Normocephalic . Face symmetric, atraumatic Neck: No thyromegaly  Lungs:  CTA B Normal respiratory effort, no intercostal retractions, no accessory muscle use. Heart: RRR,  no murmur.  no pretibial edema bilaterally  Abdomen:  Not distended, soft, non-tender. No rebound or rigidity.  Skin: Not pale. Not jaundice Neurologic:  alert & oriented X3.  Speech normal, gait appropriate for age and unassisted Psych--  Cognition and judgment appear intact.  Cooperative with normal attention span and concentration.    Behavior appropriate. No anxious or depressed appearing.    Assessment & Plan:   Labs   HIV, BMP, AST, ALT, CBC, TSH

## 2015-04-26 NOTE — Progress Notes (Signed)
Pre visit review using our clinic review tool, if applicable. No additional management support is needed unless otherwise documented below in the visit note. 

## 2015-04-26 NOTE — Assessment & Plan Note (Signed)
Immunizations uptodate  Rec to see gyn to start her Pap smears, plans to do it by 08-2015  Diet and exercise discussed Labs Return to the office 1 year

## 2015-04-26 NOTE — Patient Instructions (Signed)
Get your blood work before you leave    

## 2015-04-27 LAB — ALT: ALT: 14 U/L (ref 0–35)

## 2015-04-27 LAB — CBC WITH DIFFERENTIAL/PLATELET
Basophils Absolute: 0.1 10*3/uL (ref 0.0–0.1)
Basophils Relative: 0.7 % (ref 0.0–3.0)
EOS ABS: 0.3 10*3/uL (ref 0.0–0.7)
Eosinophils Relative: 3.2 % (ref 0.0–5.0)
HEMATOCRIT: 46.8 % — AB (ref 36.0–46.0)
HEMOGLOBIN: 15.9 g/dL — AB (ref 12.0–15.0)
LYMPHS ABS: 2.4 10*3/uL (ref 0.7–4.0)
LYMPHS PCT: 25.7 % (ref 12.0–46.0)
MCHC: 34 g/dL (ref 30.0–36.0)
MCV: 87.8 fl (ref 78.0–100.0)
Monocytes Absolute: 0.4 10*3/uL (ref 0.1–1.0)
Monocytes Relative: 4.3 % (ref 3.0–12.0)
NEUTROS PCT: 66.1 % (ref 43.0–77.0)
Neutro Abs: 6.1 10*3/uL (ref 1.4–7.7)
Platelets: 293 10*3/uL (ref 150.0–400.0)
RBC: 5.33 Mil/uL — ABNORMAL HIGH (ref 3.87–5.11)
RDW: 12.8 % (ref 11.5–15.5)
WBC: 9.2 10*3/uL (ref 4.0–10.5)

## 2015-04-27 LAB — TSH: TSH: 2.16 u[IU]/mL (ref 0.35–4.50)

## 2015-04-27 LAB — AST: AST: 20 U/L (ref 0–37)

## 2015-04-27 LAB — BASIC METABOLIC PANEL
BUN: 8 mg/dL (ref 6–23)
CALCIUM: 9.4 mg/dL (ref 8.4–10.5)
CO2: 25 mEq/L (ref 19–32)
CREATININE: 0.72 mg/dL (ref 0.40–1.20)
Chloride: 104 mEq/L (ref 96–112)
GFR: 107.81 mL/min (ref >60.00)
GLUCOSE: 80 mg/dL (ref 70–99)
Potassium: 3.7 mEq/L (ref 3.5–5.1)
Sodium: 138 mEq/L (ref 135–145)

## 2015-04-27 LAB — HIV ANTIBODY (ROUTINE TESTING W REFLEX): HIV: NONREACTIVE

## 2015-07-01 ENCOUNTER — Other Ambulatory Visit: Payer: Self-pay | Admitting: Internal Medicine

## 2015-07-04 ENCOUNTER — Other Ambulatory Visit: Payer: Self-pay | Admitting: Internal Medicine

## 2015-07-05 ENCOUNTER — Other Ambulatory Visit: Payer: Self-pay | Admitting: Internal Medicine

## 2016-02-18 ENCOUNTER — Telehealth: Payer: Self-pay | Admitting: Internal Medicine

## 2016-02-18 DIAGNOSIS — Z111 Encounter for screening for respiratory tuberculosis: Secondary | ICD-10-CM

## 2016-02-18 NOTE — Telephone Encounter (Signed)
that is okay, I prefer the TB gold rather than the PPD

## 2016-02-18 NOTE — Telephone Encounter (Signed)
TB Gold ordered. Okay for Pt to schedule lab appt at her convenience for blood draw.

## 2016-02-18 NOTE — Telephone Encounter (Signed)
Please advise 

## 2016-02-18 NOTE — Telephone Encounter (Signed)
Relation to ZO:XWRUpt:self Call back number:[phone] Pharmacy:  Reason for call:  Patient requesting TB for employer, please advise if orders are needed,

## 2016-02-18 NOTE — Telephone Encounter (Signed)
Patient scheduled with nurse for 02/22/2016

## 2016-02-22 ENCOUNTER — Other Ambulatory Visit: Payer: Commercial Managed Care - PPO

## 2016-06-09 ENCOUNTER — Ambulatory Visit (INDEPENDENT_AMBULATORY_CARE_PROVIDER_SITE_OTHER): Payer: Commercial Managed Care - PPO | Admitting: Medical

## 2016-06-09 ENCOUNTER — Encounter: Payer: Self-pay | Admitting: Medical

## 2016-06-09 ENCOUNTER — Ambulatory Visit: Payer: Commercial Managed Care - PPO | Admitting: Family Medicine

## 2016-06-09 VITALS — BP 130/76 | HR 94 | Temp 98.1°F | Ht 66.0 in | Wt 191.8 lb

## 2016-06-09 DIAGNOSIS — M609 Myositis, unspecified: Secondary | ICD-10-CM | POA: Diagnosis not present

## 2016-06-09 DIAGNOSIS — R197 Diarrhea, unspecified: Secondary | ICD-10-CM | POA: Diagnosis not present

## 2016-06-09 DIAGNOSIS — IMO0001 Reserved for inherently not codable concepts without codable children: Secondary | ICD-10-CM

## 2016-06-09 DIAGNOSIS — M791 Myalgia: Secondary | ICD-10-CM | POA: Diagnosis not present

## 2016-06-09 DIAGNOSIS — J029 Acute pharyngitis, unspecified: Secondary | ICD-10-CM

## 2016-06-09 LAB — CBC AND DIFFERENTIAL
HCT: 36 % (ref 36–46)
Hemoglobin: 11.9 g/dL — AB (ref 12.0–16.0)
PLATELETS: 200 10*3/uL (ref 150–399)
WBC: 5.2 10*3/mL

## 2016-06-09 LAB — CBC WITH DIFFERENTIAL/PLATELET
BASOS ABS: 0 {cells}/uL (ref 0–200)
BASOS PCT: 0 %
EOS ABS: 168 {cells}/uL (ref 15–500)
Eosinophils Relative: 3 %
HEMATOCRIT: 36.3 % (ref 35.0–45.0)
Hemoglobin: 11.6 g/dL — ABNORMAL LOW (ref 11.7–15.5)
LYMPHS PCT: 26 %
Lymphs Abs: 1456 cells/uL (ref 850–3900)
MCH: 23.9 pg — ABNORMAL LOW (ref 27.0–33.0)
MCHC: 32 g/dL (ref 32.0–36.0)
MCV: 74.7 fL — AB (ref 80.0–100.0)
MONO ABS: 560 {cells}/uL (ref 200–950)
MONOS PCT: 10 %
MPV: 10.4 fL (ref 7.5–12.5)
Neutro Abs: 3416 cells/uL (ref 1500–7800)
Neutrophils Relative %: 61 %
Platelets: 302 10*3/uL (ref 140–400)
RBC: 4.86 MIL/uL (ref 3.80–5.10)
RDW: 14.2 % (ref 11.0–15.0)
WBC: 5.6 10*3/uL (ref 3.8–10.8)

## 2016-06-09 LAB — COMPREHENSIVE METABOLIC PANEL
ALT: 10 U/L (ref 6–29)
AST: 15 U/L (ref 10–30)
Albumin: 4.2 g/dL (ref 3.6–5.1)
Alkaline Phosphatase: 55 U/L (ref 33–115)
BILIRUBIN TOTAL: 0.3 mg/dL (ref 0.2–1.2)
BUN: 8 mg/dL (ref 7–25)
CHLORIDE: 105 mmol/L (ref 98–110)
CO2: 26 mmol/L (ref 20–31)
CREATININE: 0.96 mg/dL (ref 0.50–1.10)
Calcium: 9.3 mg/dL (ref 8.6–10.2)
GLUCOSE: 89 mg/dL (ref 65–99)
Potassium: 4.1 mmol/L (ref 3.5–5.3)
SODIUM: 141 mmol/L (ref 135–146)
Total Protein: 7 g/dL (ref 6.1–8.1)

## 2016-06-09 LAB — POCT RAPID INFLUENZA A&B: NEG CONTROL: NEGATIVE

## 2016-06-09 LAB — POCT RAPID STREP A (OFFICE): RAPID STREP A SCREEN: NEGATIVE

## 2016-06-09 MED ORDER — DIPHENOXYLATE-ATROPINE 2.5-0.025 MG PO TABS: 2.0000 | ORAL_TABLET | Freq: Four times a day (QID) | ORAL | 0 refills | Status: DC | PRN

## 2016-06-09 MED ORDER — AMOXICILLIN 500 MG PO CAPS: 500.0000 mg | ORAL_CAPSULE | Freq: Three times a day (TID) | ORAL | 0 refills | Status: DC

## 2016-06-09 NOTE — Progress Notes (Signed)
Pre visit review using our clinic tool,if applicable. No additional management support is needed unless otherwise documented below in the visit note.  

## 2016-06-09 NOTE — Progress Notes (Signed)
Subjective:    Patient ID: Karen Lang, female    DOB: [birth year], 25 y.o.   MRN: [id number]  HPI   Pt in for evaluation.   Pt in with sore throat. Pt had 3-4 days of sore throat. Some body aches. Some pain in her rt ear as well. Low grade fever. Even today her throat hurts a lot to swallow.  Pt also has diarrhea as well.(Pt distinctly remembers in past when younger that when had strep had  loose/ diarrhea stools).  Pt has not been on antibiotics recently. 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   LMP- May 24, 2016.    Review of Systems  Constitutional: Positive for chills. Negative for fatigue and fever.  HENT: Negative for congestion and ear pain.   Respiratory: Negative for cough, choking, chest tightness and wheezing.   Cardiovascular: Negative for chest pain and palpitations.  Gastrointestinal: Positive for diarrhea. Negative for abdominal pain, constipation, nausea and vomiting.       Mild cramps when has loose stool at work.  Musculoskeletal: Positive for myalgias. Negative for back pain, neck pain and neck stiffness.  Skin: Negative for rash.  Neurological: Negative for dizziness and headaches.  Hematological: Negative for adenopathy. Does not bruise/bleed easily.  Psychiatric/Behavioral: Negative for behavioral problems and confusion.    Past Medical History:  Diagnosis Date  . Acne      Social History   Social History  . Marital status: Single    Spouse name: N/A  . Number of children: 0  . Years of education: N/A   Occupational History  . student, [employer]    Social History Main Topics  . Smoking status: Never Smoker  . Smokeless tobacco: Never Used  . Alcohol use No  . Drug use: No  . Sexual activity: Not on file   Other Topics Concern  . 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 NO  PAST SURGERIES      Family History  Problem Relation Age of Onset  . Breast cancer Other     GM  . Colon cancer Neg Hx   . Diabetes Neg Hx   . CAD Father     stents  . Hypertension Neg Hx   . Thyroid disease Father      thyroid tumor    No Known Allergies  Current Outpatient Prescriptions on File Prior to Visit  Medication Sig Dispense Refill  . cetirizine (ZYRTEC) 10 MG tablet Take 1 tablet (10 mg total) by mouth daily. 30 tablet 2  . fluticasone (FLONASE) 50 MCG/ACT nasal spray 2 sprays per nostril once daily at bedtime. Use an additional dose in the morning for the next 3 days. 16 g 3  . ibuprofen (ADVIL) 200 MG tablet Take 3 tablets (600 mg total) by mouth every 8 (eight) hours as needed for headache. 30 tablet 0  . ferrous sulfate 325 (65 FE) MG tablet Take 1 tablet (325 mg total) by mouth 2 (two) times daily. (Patient not taking: Reported on 06/09/2016) 60 tablet 6   No current facility-administered medications on file prior to visit.     BP 130/76   Pulse 94   Temp 98.1 F (36.7 C) (Oral)   Ht '5\' 6"'  (1.676 m)   Wt 191 lb 12.8 oz (  87 kg)   LMP 05/24/2016   SpO2 99%   BMI 30.96 kg/m       Objective:   Physical Exam  General  Mental Status - Alert. General Appearance - Well groomed. Not in acute distress.  Skin Rashes- No Rashes.  HEENT Head- Normal. Ear Auditory Canal - Left- Normal. Right - Normal.Tympanic Membrane- Left- Normal. Right- Normal. Eye Sclera/Conjunctiva- Left- Normal. Right- Normal. Nose & Sinuses Nasal Mucosa- Left-  Boggy and Congested. Right-  Boggy and  Congested.Bilateral  No maxillary and no frontal sinus pressure. Mouth & Throat Lips: Upper Lip- Normal: no dryness, cracking, pallor, cyanosis, or vesicular eruption. Lower Lip-Normal: no dryness, cracking, pallor, cyanosis or vesicular eruption. Buccal Mucosa- Bilateral- No Aphthous ulcers. Oropharynx- No Discharge or Erythema. Tonsils: Characteristics- Bilateral- faint  Erythema  but no  Congestion. Size/Enlargement- Bilateral- No enlargement. Discharge- bilateral-None.  Neck Neck- Supple. No Masses.   Chest and Lung Exam Auscultation: Breath Sounds:-Clear even and unlabored.  Cardiovascular Auscultation:Rythm- Regular, rate and rhythm. Murmurs & Other Heart Sounds:Ausculatation of the heart reveal- No Murmurs.  Lymphatic Head & Neck General Head & Neck Lymphatics: Bilateral: Description- No Localized lymphadenopathy.   Abdomen Inspection:-Inspection Normal.  Palpation/Perucssion: Palpation and Percussion of the abdomen reveal- Non Tender, No Rebound tenderness, No rigidity(Guarding) and No Palpable abdominal masses.  Liver:-Normal.  Spleen:- Normal.   Back- no cva pain.       Assessment & Plan:  Your rapid strep was negative. We will send out a throat culture.  With your st and diarrhea, I do not want you to start antibiotic just yet since rapid strep negative and anitibitoic may cause loose stool.  But if your st worsens pending throat culture then start amoxicillin.  Over weekend bland diet guidline, hydrate and lomotil( but start tomorrow)  If loose stools by tomorrow afternoon go ahead and turn in stool panel kit.  Will get cbc and cmp today.  Follow up in 7 days or as needed  [phone] (tried to call pt and let her know flu test negative but no answer)  Davinci Glotfelty, Percell Miller, PA-C

## 2016-06-09 NOTE — Patient Instructions (Addendum)
Your rapid strep was negative. We will send out a throat culture.  With your st and diarrhea, I do not want you to start antibiotic just yet since rapid strep negative and anitibitoic may cause loose stool.  But if your st worsens pending throat culture then start amoxicillin.  Over weekend bland diet guideline, hydrate and lomotil( but start tomorrow)  If loose stools by tomorrow afternoon go ahead and turn in stool panel kit.   Will get cbc and cmp today.  Follow up in 7 days or as needed

## 2016-06-12 LAB — CULTURE, GROUP A STREP

## 2016-06-14 LAB — HM PAP SMEAR

## 2016-07-24 ENCOUNTER — Ambulatory Visit (INDEPENDENT_AMBULATORY_CARE_PROVIDER_SITE_OTHER): Payer: Commercial Managed Care - PPO | Admitting: Internal Medicine

## 2016-07-24 ENCOUNTER — Encounter: Payer: Self-pay | Admitting: Internal Medicine

## 2016-07-24 DIAGNOSIS — Z0000 Encounter for general adult medical examination without abnormal findings: Secondary | ICD-10-CM

## 2016-07-24 LAB — LIPID PANEL
CHOLESTEROL: 143 mg/dL (ref 0–200)
HDL: 46.9 mg/dL (ref >39.00)
LDL Cholesterol: 78 mg/dL (ref 0–99)
NONHDL: 95.75
TRIGLYCERIDES: 90 mg/dL (ref 0.0–149.0)
Total CHOL/HDL Ratio: 3
VLDL: 18 mg/dL (ref 0.0–40.0)

## 2016-07-24 NOTE — Progress Notes (Signed)
Pre visit review using our clinic review tool, if applicable. No additional management support is needed unless otherwise documented below in the visit note. 

## 2016-07-24 NOTE — Progress Notes (Signed)
Subjective:    Patient ID: Karen Lang, female    DOB: 02/24/1991, 25 y.o.   MRN: [id number]  DOS:  07/24/2016 Type of visit - description : cpx Interval history: Feeling well, no major concerns.   Review of Systems Constitutional: No fever. No chills. No unexplained wt changes. No unusual sweats  HEENT: No dental problems, no ear discharge, no facial swelling, no voice changes. No eye discharge, no eye  redness , no  intolerance to light   Respiratory: No wheezing , no  difficulty breathing. No cough , no mucus production  Cardiovascular: No CP, no leg swelling , no  Palpitations  GI: no nausea, no vomiting, no diarrhea , no  abdominal pain.  No blood in the stools. No dysphagia, no odynophagia    Endocrine: No polyphagia, no polyuria , no polydipsia  GU: No dysuria, gross hematuria, difficulty urinating. No urinary urgency, no frequency.  Musculoskeletal: No joint swellings or unusual aches or pains  Skin: No change in the color of the skin, palor , no  Rash  Allergic, immunologic: No environmental allergies , no  food allergies  Neurological: No dizziness no  syncope. No headaches. No diplopia, no slurred, no slurred speech, no motor deficits, no facial  Numbness  Hematological: No enlarged lymph nodes, no easy bruising , no unusual bleedings  Psychiatry: No suicidal ideas, no hallucinations, no beavior problems, no confusion.  No unusual/severe anxiety, no depression   Past Medical History:  Diagnosis Date  . Acne     Past Surgical History:  Procedure Laterality Date  . NO PAST SURGERIES      Social History   Social History  . Marital status: Single    Spouse name: N/A  . Number of children: 0  . Years of education: N/A   Occupational History  . RN [employer]    Social History Main Topics  . Smoking status: Never Smoker  . Smokeless tobacco: Never Used  . Alcohol use No  . Drug use: No  . Sexual activity: Not on file   Other Topics Concern  .  Not on file   Social History Narrative   RN   Mother is [name]   Family History  Problem Relation Age of Onset  . CAD Father 48    stents  . Thyroid disease Father      thyroid tumor  . Breast cancer Maternal Grandfather   . Colon cancer Neg Hx   . Diabetes Neg Hx   . Hypertension Neg Hx         Medication List       Accurate as of 07/24/16 11:59 PM. Always use your most recent med list.          cetirizine 10 MG tablet Commonly known as:  ZYRTEC Take 1 tablet (10 mg total) by mouth daily.   FERRALET 90 90-1 MG Tabs Take 1 tablet by mouth daily.          Objective:   Physical Exam BP 108/74 (BP Location: Left Arm, Patient Position: Sitting, Cuff Size: Normal)   Pulse 69   Temp 98.3 F (36.8 C) (Oral)   Resp 14   Ht 5\' 6"  (1.676 m)   Wt 192 lb 2 oz (87.1 kg)   LMP 07/24/2016   SpO2 99%   BMI 31.01 kg/m   General:   Well developed, well nourished . NAD.  Neck: No  thyromegaly  HEENT:  Normocephalic . Face symmetric, atraumatic Lungs:  CTA B Normal respiratory effort, no intercostal retractions, no accessory muscle use. Heart: RRR,  no murmur.  No pretibial edema bilaterally  Abdomen:  Not distended, soft, non-tender. No rebound or rigidity.   Skin: Exposed areas without rash. Not pale. Not jaundice Neurologic:  alert & oriented X3.  Speech normal, gait appropriate for age and unassisted Strength symmetric and appropriate for age.  Psych: Cognition and judgment appear intact.  Cooperative with normal attention span and concentration.  Behavior appropriate. No anxious or depressed appearing.    Assessment & Plan:

## 2016-07-24 NOTE — Assessment & Plan Note (Addendum)
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 She recently graduated as a Engineer, civil (consulting)nurse, working at the ER, feeling well, diet and exercise have definitely improved. Labs reviewed: Due for an FLP. RTC one year

## 2016-07-24 NOTE — Patient Instructions (Signed)
GO TO THE LAB : Get the blood work     GO TO THE FRONT DESK Schedule your next appointment for a  Physical in 1 year  

## 2016-09-05 ENCOUNTER — Telehealth: Payer: Self-pay | Admitting: Internal Medicine

## 2016-09-05 NOTE — Telephone Encounter (Signed)
Caller name: Jerre Simonlexis Scalf Relationship to patient: self Can be reached: [phone]  Reason for call: Pt called [facility] billing and was directed back to our office. Pt was in our office 06/09/16 for sick visit. Pt states she paid $25.00 copay that was not applied to visit now she is getting a bill for $40 copay. She said the $25.00 payment cleared the bank 06/12/16. She is asking why it is not applied to this bill. Please f/u with pt.

## 2016-09-13 ENCOUNTER — Ambulatory Visit (INDEPENDENT_AMBULATORY_CARE_PROVIDER_SITE_OTHER): Payer: Commercial Managed Care - PPO | Admitting: Internal Medicine

## 2016-09-13 ENCOUNTER — Encounter: Payer: Self-pay | Admitting: Internal Medicine

## 2016-09-13 VITALS — BP 122/74 | HR 98 | Temp 98.4°F | Resp 12 | Ht 66.0 in | Wt 195.4 lb

## 2016-09-13 DIAGNOSIS — J029 Acute pharyngitis, unspecified: Secondary | ICD-10-CM | POA: Diagnosis not present

## 2016-09-13 MED ORDER — AZITHROMYCIN 250 MG PO TABS: ORAL_TABLET | ORAL | 0 refills | Status: DC

## 2016-09-13 NOTE — Telephone Encounter (Signed)
Told patient I did not see a bill for $40 in her account. The account shows she has a 0 balance with a 25 refund due. I told patient there was a time in September our system was pulling speciality fees instead of PCP.

## 2016-09-13 NOTE — Progress Notes (Signed)
   Subjective:    Patient ID: Karen Lang, female    DOB: 03/20/1991, 25 y.o.   MRN: [id number]  DOS:  09/13/2016 Type of visit - description : acute, here with her mother Interval history: Symptoms started a week ago with sore throat, throat irritation and some mucus pooling in the throat. Gradually getting worse, 5 days ago started with cough and is developing chest congestion. She is bringing up sputum, color?.  Review of Systems No actual fever or chills Minimal sinus congestion No sneezing, itchy eyes or itchy nose.  Past Medical History:  Diagnosis Date  . Acne     Past Surgical History:  Procedure Laterality Date  . NO PAST SURGERIES      Social History   Social History  . Marital status: Single    Spouse name: N/A  . Number of children: 0  . Years of education: N/A   Occupational History  . RN [employer]    Social History Main Topics  . Smoking status: Never Smoker  . Smokeless tobacco: Never Used  . Alcohol use No  . Drug use: No  . Sexual activity: Not on file   Other Topics Concern  . Not on file   Social History Narrative   RN   Mother is [name]      Allergies as of 09/13/2016   No Known Allergies     Medication List       Accurate as of 09/13/16 11:59 PM. Always use your most recent med list.          azithromycin 250 MG tablet Commonly known as:  ZITHROMAX Z-PAK 2 tabs a day the first day, then 1 tab a day x 4 days   cetirizine 10 MG tablet Commonly known as:  ZYRTEC Take 1 tablet (10 mg total) by mouth daily.   FERRALET 90 90-1 MG Tabs Take 1 tablet by mouth daily.          Objective:   Physical Exam BP 122/74 (BP Location: Left Arm, Patient Position: Sitting, Cuff Size: Normal)   Pulse 98   Temp 98.4 F (36.9 C) (Oral)   Resp 12   Ht 5\' 6"  (1.676 m)   Wt 195 lb 6 oz (88.6 kg)   LMP 08/14/2016 (Approximate)   SpO2 97%   BMI 31.53 kg/m  General:   Well developed, well nourished . NAD.  HEENT:  Normocephalic  . Face symmetric, atraumatic. TMs: Slightly bulge bilaterally, no red. Throat: Symmetric, tonsils normal increase in size, minimal redness if any. No discharge. Nose is slightly congested, sinuses no TTP. Lungs:  CTA B Normal respiratory effort, no intercostal retractions, no accessory muscle use. Heart: RRR,  no murmur.  No pretibial edema bilaterally  Skin: Not pale. Not jaundice Neurologic:  alert & oriented X3.  Speech normal, gait appropriate for age and unassisted Psych--  Cognition and judgment appear intact.  Cooperative with normal attention span and concentration.  Behavior appropriate. No anxious or depressed appearing.      Assessment & Plan:   Assessment Allergies Acne  Plan: 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 exam is benign. Rapid strep test not available, will send a throat culture, cover w/ abx for now; otherwise supportive care. See instructions

## 2016-09-13 NOTE — Patient Instructions (Addendum)
Rest, fluids , tylenol  For cough:  Take Mucinex DM twice a day as needed until better  For nasal congestion: Use OTC Nasocort or Flonase : 2 nasal sprays on each side of the nose in the morning until you feel better    Take the antibiotic as prescribed  (zithromax)  Call if not gradually better over the next  few days  Call anytime if the symptoms are severe

## 2016-09-13 NOTE — Telephone Encounter (Signed)
Patient was in the office today inquiring about message below, bill left on your desk, advised patient office manager will follow up Friday the latest.

## 2016-09-13 NOTE — Progress Notes (Signed)
Pre visit review using our clinic review tool, if applicable. No additional management support is needed unless otherwise documented below in the visit note. 

## 2016-09-13 NOTE — Telephone Encounter (Signed)
Call patient back left msg

## 2016-09-13 NOTE — Telephone Encounter (Signed)
Talked to billing. They will transfer the $25 copayment to the correct account patient can disregard the previous bill she will get one for $25 in the future.

## 2016-09-14 DIAGNOSIS — Z09 Encounter for follow-up examination after completed treatment for conditions other than malignant neoplasm: Secondary | ICD-10-CM | POA: Insufficient documentation

## 2016-09-14 NOTE — Assessment & Plan Note (Signed)
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 exam is benign. Rapid strep test not available, will send a throat culture, cover w/ abx for now; otherwise supportive care. See instructions

## 2016-09-15 LAB — CULTURE, GROUP A STREP

## 2016-09-16 ENCOUNTER — Other Ambulatory Visit: Payer: Self-pay | Admitting: Internal Medicine

## 2016-09-16 MED ORDER — AMOXICILLIN 500 MG PO CAPS: 1000.0000 mg | ORAL_CAPSULE | Freq: Two times a day (BID) | ORAL | 0 refills | Status: DC

## 2017-04-11 ENCOUNTER — Ambulatory Visit (INDEPENDENT_AMBULATORY_CARE_PROVIDER_SITE_OTHER): Payer: Commercial Managed Care - PPO | Admitting: Medical

## 2017-04-11 ENCOUNTER — Encounter: Payer: Self-pay | Admitting: Medical

## 2017-04-11 VITALS — BP 136/89 | HR 89 | Temp 98.4°F | Resp 16 | Ht 66.0 in | Wt 188.4 lb

## 2017-04-11 DIAGNOSIS — J029 Acute pharyngitis, unspecified: Secondary | ICD-10-CM | POA: Diagnosis not present

## 2017-04-11 MED ORDER — AMOXICILLIN 875 MG PO TABS: 875.0000 mg | ORAL_TABLET | Freq: Two times a day (BID) | ORAL | 0 refills | Status: DC

## 2017-04-11 NOTE — Patient Instructions (Signed)
Your strep test was negative. However, your physical exam, level of pain and associated symptoms indicate possible strep. So I am going to give you amoxicillin. Send out culture pending. We will let you know results of test and see how you are after initial days of antibiotic.  In event symptoms persist or change may expand work up to include cbc and mono studies(particular if severe fatigue becomes predominant feature).  I think your loose stools will taper off. But if not stool panel studies may be needed.(use probiotic otc while on antibiotic  Rest hydrate, tylenol for fever, and warm salt water gargles.   Follow up in 7 days or as needed.

## 2017-04-11 NOTE — Progress Notes (Signed)
Subjective:    Patient ID: Karen Lang, female    DOB: 12/29/1990, 26 y.o.   MRN: [id number]  HPI  Pt had st for about 2 weeks. Pt has some ha, mild body aches and fatigue. Pt works in ED. Pt states in past with strep she had diarrhea. Pt has some mild abdomen pain/ache.(but her predominant most severe symptom is st. Hurts even to swallow her saliva).  Note on chart review pt had 2 cultures in past year that showed strep throat.  No skin rash.   LMP- Pt on ocp. About 3.5 weeks ago..  Pt did just get back from [place] but drank only bottled water.   Review of Systems  Constitutional: Positive for fatigue. Negative for chills, diaphoresis and fever.       Mild fatigue  HENT: Positive for sore throat. Negative for congestion, drooling, facial swelling, hearing loss, nosebleeds, rhinorrhea, sinus pain and sinus pressure.        Mild/rare sneezing.  Respiratory: Negative for cough, chest tightness, shortness of breath and wheezing.   Cardiovascular: Negative for chest pain and palpitations.  Gastrointestinal: Positive for abdominal pain and diarrhea. Negative for constipation.  Musculoskeletal: Negative for back pain, joint swelling, myalgias, neck pain and neck stiffness.       Mild achy.  Skin: Negative for rash.  Neurological: Negative for dizziness, speech difficulty, weakness, numbness and headaches.  Hematological: Negative for adenopathy.  Psychiatric/Behavioral: Negative for behavioral problems and confusion.   Past Medical History:  Diagnosis Date  . Acne      Social History   Social History  . Marital status: Single    Spouse name: N/A  . Number of children: 0  . Years of education: N/A   Occupational History  . RN [employer]    Social History Main Topics  . Smoking status: Never Smoker  . Smokeless tobacco: Never Used  . Alcohol use No  . Drug use: No  . Sexual activity: Not on file   Other Topics Concern  . Not on file   Social History Narrative   RN     Mother is [name]    Past Surgical History:  Procedure Laterality Date  . NO PAST SURGERIES      Family History  Problem Relation Age of Onset  . CAD Father 48       stents  . Thyroid disease Father         thyroid tumor  . Breast cancer Maternal Grandfather   . Colon cancer Neg Hx   . Diabetes Neg Hx   . Hypertension Neg Hx     No Known Allergies  Current Outpatient Prescriptions on File Prior to Visit  Medication Sig Dispense Refill  . cetirizine (ZYRTEC) 10 MG tablet Take 1 tablet (10 mg total) by mouth daily. 30 tablet 2  . Fe Cbn-Fe Gluc-FA-B12-C-DSS (FERRALET 90) 90-1 MG TABS Take 1 tablet by mouth daily.     No current facility-administered medications on file prior to visit.     BP 136/89   Pulse 89   Temp 98.4 F (36.9 C) (Oral)   Resp 16   Ht 5\' 6"  (1.676 m)   Wt 188 lb 6.4 oz (85.5 kg)   SpO2 100%   BMI 30.41 kg/m       Objective:   Physical Exam  General  Mental Status - Alert. General Appearance - Well groomed. Not in acute distress.  Skin Rashes- No Rashes.  HEENT Head-  Normal. Ear Auditory Canal - Left- Normal. Right - Normal.Tympanic Membrane- Left- Normal. Right- Normal. Eye Sclera/Conjunctiva- Left- Normal. Right- Normal. Nose & Sinuses Nasal Mucosa- Left-  Boggy and Congested. Right-  Boggy and  Congested.Bilateral maxillary and frontal sinus pressure. Mouth & Throat Lips: Upper Lip- Normal: no dryness, cracking, pallor, cyanosis, or vesicular eruption. Lower Lip-Normal: no dryness, cracking, pallor, cyanosis or vesicular eruption. Buccal Mucosa- Bilateral- No Aphthous ulcers. Oropharynx- No Discharge or Erythema. Tonsils: Characteristics- Bilateral- moderate Erythema or Congestion. Size/Enlargement- Bilateral- 1+ enlargement. Discharge- bilateral-None.  Neck Neck- Supple. No Masses. Mild 1+ enlargement to submandibular lymph nodes but not tender.   Chest and Lung Exam Auscultation: Breath Sounds:-Clear even and  unlabored.  Cardiovascular Auscultation:Rythm- Regular, rate and rhythm. Murmurs & Other Heart Sounds:Ausculatation of the heart reveal- No Murmurs.  Lymphatic Head & Neck General Head & Neck Lymphatics: Bilateral: Description- No Localized lymphadenopathy. See neck exam.    Abdomen Inspection:-Inspection Normal.  Palpation/Perucssion: Palpation and Percussion of the abdomen reveal- Non Tender, No Rebound tenderness, No rigidity(Guarding) and No Palpable abdominal masses.  Liver:-Normal.  Spleen:- No spleenomegaly  Back- no cva tenderness.          Assessment & Plan:  Your strep test was negative. However, your physical exam, level of pain and associated symptoms indicate possible strep. So I am going to give you amoxicillin. Send out culture pending. We will let you know results of test and see how you are after initial days of antibiotic.  In event symptoms persist or change may expand work up to include cbc and mono studies(particular if severe fatigue becomes predominant feature).  I think your loose stools will taper off. But if not stool panel studies may be needed.(use probiotic otc while on antibiotic  Rest hydrate, tylenol for fever, and warm salt water gargles.   Follow up in 7 days or as needed.   Artavia Jeanlouis, Ramon DredgeEdward, PA-C

## 2017-04-13 LAB — CULTURE, GROUP A STREP

## 2017-04-16 ENCOUNTER — Encounter: Payer: Self-pay | Admitting: Internal Medicine

## 2017-05-30 LAB — BASIC METABOLIC PANEL
BUN: 10 (ref 4–21)
Creatinine: 0.9 (ref 0.5–1.1)
Glucose: 81
Potassium: 4 (ref 3.4–5.3)
Sodium: 138 (ref 137–147)

## 2017-05-30 LAB — TSH: TSH: 2.91 (ref 0.41–5.90)

## 2017-06-11 DIAGNOSIS — H40003 Preglaucoma, unspecified, bilateral: Secondary | ICD-10-CM | POA: Insufficient documentation

## 2017-06-11 DIAGNOSIS — Z83511 Family history of glaucoma: Secondary | ICD-10-CM | POA: Insufficient documentation

## 2017-07-27 ENCOUNTER — Encounter: Payer: Self-pay | Admitting: Internal Medicine

## 2017-07-27 ENCOUNTER — Ambulatory Visit: Payer: PRIVATE HEALTH INSURANCE | Admitting: Internal Medicine

## 2017-07-27 VITALS — BP 116/70 | HR 81 | Temp 98.2°F | Resp 14 | Ht 66.0 in | Wt 189.5 lb

## 2017-07-27 DIAGNOSIS — Z0000 Encounter for general adult medical examination without abnormal findings: Secondary | ICD-10-CM | POA: Diagnosis not present

## 2017-07-27 DIAGNOSIS — E282 Polycystic ovarian syndrome: Secondary | ICD-10-CM | POA: Insufficient documentation

## 2017-07-27 HISTORY — DX: Polycystic ovarian syndrome: E28.2

## 2017-07-27 LAB — LIPID PANEL
Cholesterol: 156 mg/dL (ref 0–200)
HDL: 51.2 mg/dL (ref >39.00)
LDL Cholesterol: 89 mg/dL (ref 0–99)
NonHDL: 104.36
TRIGLYCERIDES: 75 mg/dL (ref 0.0–149.0)
Total CHOL/HDL Ratio: 3
VLDL: 15 mg/dL (ref 0.0–40.0)

## 2017-07-27 NOTE — Patient Instructions (Signed)
GO TO THE LAB : Get the blood work     GO TO THE FRONT DESK Schedule your next appointment for a  Physical exam in 1 or 2 years   

## 2017-07-27 NOTE — Progress Notes (Signed)
Subjective:    Patient ID: Karen Lang, female    DOB: 01/11/1991, 26 y.o.   MRN: [id number]  DOS:  07/27/2017 Type of visit - description : cpx Interval history: Doing well, no major concerns   Review of Systems Few months ago, due to heavy periods, labs were done, diagnosed with PCOS.  Other than above, a 14 point review of systems is negative     Past Medical History:  Diagnosis Date  . Acne   . PCOS (polycystic ovarian syndrome) 07/27/2017     Past Surgical History:  Procedure Laterality Date  . NO PAST SURGERIES      Social History   Socioeconomic History  . Marital status: Single    Spouse name: Not on file  . Number of children: 0  . Years of education: Not on file  . Highest education level: Not on file  Social Needs  . Financial resource strain: Not on file  . Food insecurity - worry: Not on file  . Food insecurity - inability: Not on file  . Transportation needs - medical: Not on file  . Transportation needs - non-medical: Not on file  Occupational History  . 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 Smoking status: Never Smoker  . Smokeless tobacco: Never Used  Substance and Sexual Activity  . Alcohol use: No  . Drug use: No  . Sexual activity: Not on file  Other Topics Concern  . 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 CAD Father 48       stents  . Thyroid disease Father         thyroid tumor  . Breast cancer Maternal Grandfather   . Colon cancer Neg Hx   . Diabetes Neg Hx   . Hypertension Neg Hx      Allergies as of 07/27/2017   No Known Allergies     Medication List        Accurate as of 07/27/17  4:56 PM. Always use your most recent med list.          cetirizine 10 MG tablet Commonly known as:  ZYRTEC Take 1 tablet (10 mg total) by mouth daily.   drospirenone-ethinyl estradiol 3-0.03 MG tablet Commonly known as:   YASMIN,ZARAH,SYEDA Take 1 tablet by mouth daily.   metFORMIN 500 MG tablet Commonly known as:  GLUCOPHAGE Take by mouth daily.          Objective:   Physical Exam BP 116/70 (BP Location: Left Arm, Patient Position: Sitting, Cuff Size: Small)   Pulse 81   Temp 98.2 F (36.8 C) (Oral)   Resp 14   Ht 5\' 6"  (1.676 m)   Wt 189 lb 8 oz (86 kg)   SpO2 98%   BMI 30.59 kg/m  General:   Well developed, well nourished . NAD.  Neck: No  thyromegaly  HEENT:  Normocephalic . Face symmetric, atraumatic Lungs:  CTA B Normal respiratory effort, no intercostal retractions, no accessory muscle use. Heart: RRR,  no murmur.  No pretibial edema bilaterally  Abdomen:  Not distended, soft, non-tender. No rebound or rigidity.   Skin: Exposed areas without rash. Not pale. Not jaundice Neurologic:  alert & oriented X3.  Speech normal, gait appropriate for age and unassisted Strength symmetric and appropriate for age.  Psych:  Cognition and judgment appear intact.  Cooperative with normal attention span and concentration.  Behavior appropriate. No anxious or depressed appearing.     Assessment & Plan:   Assessment Allergies Acne PCOS dx 2018  Plan: PCOS: Due to heavy periods, she was tested, DX PCOS, treated w/  metformin, f/u by gynecology and endocrinology. RTC 1 or 2 years

## 2017-07-27 NOTE — Progress Notes (Signed)
Pre visit review using our clinic review tool, if applicable. No additional management support is needed unless otherwise documented below in the visit note. 

## 2017-07-27 NOTE — Assessment & Plan Note (Addendum)
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 -RTC 1-2 years

## 2017-07-27 NOTE — Assessment & Plan Note (Signed)
PCOS: Due to heavy periods, she was tested, DX PCOS, treated w/  metformin, f/u by gynecology and endocrinology. RTC 1 or 2 years

## 2017-08-14 ENCOUNTER — Telehealth: Payer: Self-pay | Admitting: Internal Medicine

## 2017-08-14 NOTE — Telephone Encounter (Signed)
Copied from CRM [phone]. Topic: Quick Communication - See Telephone Encounter >> Aug 14, 2017 12:17 PM Herby AbrahamJohnson, Shiquita C wrote: CRM for notification. See Telephone encounter for:  08/14/17.    Pt called in because she said that she paid a co-pay (35.00) on DOS 07/27/17 for a CPE. Pt says that she shouldn't have and would like to be reimbursed.    Please assist further.    CB: [phone]

## 2017-08-15 NOTE — Telephone Encounter (Signed)
Message sent to billing department to initial the re-fund. Our office is only able to refund prior to end of day close. Left message informing patient that this request has been made

## 2017-10-09 ENCOUNTER — Ambulatory Visit: Payer: PRIVATE HEALTH INSURANCE | Admitting: Internal Medicine

## 2017-10-09 ENCOUNTER — Encounter: Payer: Self-pay | Admitting: Internal Medicine

## 2017-10-09 VITALS — BP 118/68 | HR 105 | Temp 98.5°F | Resp 14 | Ht 66.0 in | Wt 194.5 lb

## 2017-10-09 DIAGNOSIS — J069 Acute upper respiratory infection, unspecified: Secondary | ICD-10-CM

## 2017-10-09 DIAGNOSIS — J029 Acute pharyngitis, unspecified: Secondary | ICD-10-CM | POA: Diagnosis not present

## 2017-10-09 LAB — POCT RAPID STREP A (OFFICE): RAPID STREP A SCREEN: NEGATIVE

## 2017-10-09 MED ORDER — AZELASTINE HCL 0.1 % NA SOLN: 2.0000 | Freq: Every evening | NASAL | 3 refills | Status: DC | PRN

## 2017-10-09 NOTE — Progress Notes (Signed)
Pre visit review using our clinic review tool, if applicable. No additional management support is needed unless otherwise documented below in the visit note. 

## 2017-10-09 NOTE — Progress Notes (Signed)
Subjective:    Patient ID: Karen Lang, female    DOB: 04/13/1991, 27 y.o.   MRN: [id number]  DOS:  10/09/2017 Type of visit - description : acute Interval history: Symptoms of started 3 days ago with sore throat, worse with swallowing, mild runny nose. She started Zyrtec and Mucinex 2 days ago symptoms got more noticeable with increased runny nose, clear and yellow in color. Started to have a low-grade fever last night along with vomiting.  No persistent nausea or vomiting at this point. 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 No cough or chest congestion, mild shortness of breath? No diarrhea  Past Medical History:  Diagnosis Date  . Acne   . PCOS (polycystic ovarian syndrome) 07/27/2017    Past Surgical History:  Procedure Laterality Date  . NO PAST SURGERIES      Social History   Socioeconomic History  . Marital status: Single    Spouse name: Not on file  . Number of children: 0  . Years of education: Not on file  . Highest education level: Not on file  Social Needs  . Financial resource strain: Not on file  . Food insecurity - worry: Not on file  . Food insecurity - inability: Not on file  . Transportation needs - medical: Not on file  . Transportation needs - non-medical: Not on file  Occupational History  . 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 Smoking status: Never Smoker  . Smokeless tobacco: Never Used  Substance and Sexual Activity  . Alcohol use: No  . Drug use: No  . Sexual activity: Not on file  Other Topics Concern  . 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 Always use your most recent med list.          azelastine 0.1 % nasal spray Commonly known as:   ASTELIN Place 2 sprays into both nostrils at bedtime as needed for rhinitis. Use in each nostril as directed   cetirizine 10 MG tablet Commonly known as:  ZYRTEC Take 1 tablet (10 mg total) by mouth daily.   drospirenone-ethinyl estradiol 3-0.03 MG tablet Commonly known as:  YASMIN,ZARAH,SYEDA Take 1 tablet by mouth daily.   metFORMIN 500 MG tablet Commonly known as:  GLUCOPHAGE Take by mouth daily.          Objective:   Physical Exam BP 118/68 (BP Location: Left Arm, Patient Position: Sitting, Cuff Size: Small)   Pulse (!) 105   Temp 98.5 F (36.9 C) (Oral)   Resp 14   Ht 5\' 6"  (1.676 m)   Wt 194 lb 8 oz (88.2 kg)   LMP 10/07/2017 (Exact Date)   SpO2 97%   BMI 31.39 kg/m  General:   Well developed, well nourished, afebrile, no distress, nontoxic appearing.  HEENT:  Normocephalic . Face symmetric, atraumatic. TMs: Slightly bulge but otherwise normal. Throat symmetric, no red. Nose congested, sinuses non-TTP Neck: Few small LEDs bilaterally Lungs:  CTA B Normal respiratory effort, no intercostal retractions, no accessory muscle use. Heart: RRR,  no murmur.  No pretibial edema bilaterally  Exline abdomen: Soft, nontender. Skin: Not pale. Not jaundice Neurologic:  alert & oriented X3.  Speech normal, gait appropriate for age and unassisted Psych--  Cognition and judgment appear intact.  Cooperative with normal attention span and concentration.  Behavior appropriate. No anxious or depressed appearing.      Assessment & Plan:   Assessment Allergies Acne PCOS dx 2018  Plan: Viral syndrome: strep test negative, symptoms started 3 days ago, probably a viral syndrome.  Slightly tachycardic but nontoxic appearing.  Conservative treatment, see AVS

## 2017-10-09 NOTE — Patient Instructions (Signed)
Rest, fluids , tylenol  For cough:  Take Mucinex DM twice a day as needed until better  For nasal congestion: Use OTC Nasocort or Flonase : 2 nasal sprays on each side of the nose in the morning until you feel better Use ASTELIN a prescribed spray : 2 nasal sprays on each side of the nose at night until you feel better   Call if not gradually better over the next  10 days  Call anytime if the symptoms are severe, you have high fever, short of breath, chest pain

## 2017-10-10 NOTE — Assessment & Plan Note (Signed)
Viral syndrome: strep test negative, symptoms started 3 days ago, probably a viral syndrome.  Slightly tachycardic but nontoxic appearing.  Conservative treatment, see AVS

## 2018-02-18 ENCOUNTER — Telehealth: Payer: Self-pay | Admitting: Internal Medicine

## 2018-02-18 NOTE — Telephone Encounter (Signed)
Copied from CRM [phone]. Topic: Quick Communication - See Telephone Encounter >> Feb 18, 2018 10:03 AM Jay SchlichterWeikart, Melissa J wrote: CRM for notification. See Telephone encounter for: 02/18/18. Pt is travelling to [place] - she is asking for malaria medication  [facility]

## 2018-02-18 NOTE — Telephone Encounter (Signed)
Needs travel advice- recommend OV to discuss. Please schedule at Pt's convenience. Thank you.

## 2018-02-18 NOTE — Telephone Encounter (Signed)
Please advise 

## 2018-02-18 NOTE — Telephone Encounter (Signed)
Need more information, I have asked her mom to come to the office to discuss her trip and based on that discussion I  most likely be able to prescribe medication to [facility]

## 2018-02-19 NOTE — Telephone Encounter (Signed)
Called pt and mother scheduled appt for 02/20/18 @4pm .

## 2018-02-20 ENCOUNTER — Ambulatory Visit (INDEPENDENT_AMBULATORY_CARE_PROVIDER_SITE_OTHER): Payer: PRIVATE HEALTH INSURANCE | Admitting: Internal Medicine

## 2018-02-20 ENCOUNTER — Other Ambulatory Visit: Payer: Self-pay

## 2018-02-20 ENCOUNTER — Encounter: Payer: Self-pay | Admitting: Internal Medicine

## 2018-02-20 VITALS — BP 114/76 | HR 91 | Temp 98.4°F | Resp 16 | Ht 65.5 in | Wt 193.6 lb

## 2018-02-20 DIAGNOSIS — Z7189 Other specified counseling: Secondary | ICD-10-CM

## 2018-02-20 DIAGNOSIS — Z7184 Encounter for health counseling related to travel: Secondary | ICD-10-CM

## 2018-02-20 MED ORDER — ATOVAQUONE-PROGUANIL HCL 250-100 MG PO TABS: 1.0000 | ORAL_TABLET | Freq: Every day | ORAL | 0 refills | Status: DC

## 2018-02-20 NOTE — Progress Notes (Signed)
Subjective:    Patient ID: Karen Lang, female    DOB: 07/10/1991, 27 y.o.   MRN: [id number]  DOS:  02/20/2018 Type of visit - description : Probable advise Interval history: Going to the [place] from 03/04/2018 to 03/10/2018. Request malaria prophylaxis.   Review of Systems   Past Medical History:  Diagnosis Date  . Acne   . PCOS (polycystic ovarian syndrome) 07/27/2017    Past Surgical History:  Procedure Laterality Date  . NO PAST SURGERIES      Social History   Socioeconomic History  . Marital status: Single    Spouse name: Not on file  . Number of children: 0  . Years of education: Not on file  . Highest education level: Not on file  Occupational History  . 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 Financial resource strain: Not on file  . Food insecurity:    Worry: Not on file    Inability: Not on file  . Transportation needs:    Medical: Not on file    Non-medical: Not on file  Tobacco Use  . Smoking status: Never Smoker  . Smokeless tobacco: Never Used  Substance and Sexual Activity  . Alcohol use: No  . Drug use: No  . Sexual activity: Not on file  Lifestyle  . Physical activity:    Days per week: Not on file    Minutes per session: Not on file  . Stress: Not on file  Relationships  . Social connections:    Talks on phone: Not on file    Gets together: Not on file    Attends religious service: Not on file    Active member of club or organization: Not on file    Attends meetings of clubs or organizations: Not on file    Relationship status: Not on file  . Intimate partner violence:    Fear of current or ex partner: Not on file    Emotionally abused: Not on file    Physically abused: Not on file    Forced sexual activity: Not on file  Other Topics Concern  . Not on file  Social History Narrative   RN   Mother is Mrs Karen Lang   Lives w/ mother       Allergies as of 02/20/2018   No Known Allergies     Medication List        Accurate as of 02/20/18  5:05 PM. Always use your most recent med list.          atovaquone-proguanil 250-100 MG Tabs tablet Commonly known as:  MALARONE Take 1 tablet by mouth daily.   azelastine 0.1 % nasal spray Commonly known as:  ASTELIN Place 2 sprays into both nostrils at bedtime as needed for rhinitis. Use in each nostril as directed   cetirizine 10 MG tablet Commonly known as:  ZYRTEC Take 1 tablet (10 mg total) by mouth daily.   drospirenone-ethinyl estradiol 3-0.03 MG tablet Commonly known as:  YASMIN,ZARAH,SYEDA Take 1 tablet by mouth daily.   metFORMIN 500 MG tablet Commonly known as:  GLUCOPHAGE Take by mouth daily.          Objective:   Physical Exam BP 114/76 (BP Location: Left Arm, Patient Position: Sitting, Cuff Size: Large)   Pulse 91   Temp 98.4 F (36.9 C) (Oral)   Resp 16   Ht 5' 5.5" (1.664 m)   Wt 193 lb 9.6 oz (87.8 kg)   LMP  02/06/2018   SpO2 98%   BMI 31.73 kg/m  General:   Well developed, well nourished . NAD.  HEENT:  Normocephalic . Face symmetric, atraumatic Skin: Not pale. Not jaundice Neurologic:  alert & oriented X3.  Speech normal, gait appropriate for age and unassisted Psych--  Cognition and judgment appear intact.  Cooperative with normal attention span and concentration.  Behavior appropriate. No anxious or depressed appearing.      Assessment & Plan:   Assessment Allergies Acne PCOS dx 2018  Plan: Travel advice: The patient and her mother  already did some research and they request malaria prophylaxis. Going to [place], [employer] checked. They already had hepatitis A immunization, report they don't need typhoid vaccination as they do not plan to eat anywhere but in the hotel. Options discussed  for malaria prophylaxis, mefloquine versus Malarone. Elected Malarone, prescription for #14 provided. General safety discussed.  See AVS

## 2018-02-20 NOTE — Patient Instructions (Signed)
start Malarone 1 to 2 days prior to entering a malaria-endemic area, continue throughout the stay and for 7 days after returning.  

## 2018-02-21 NOTE — Assessment & Plan Note (Signed)
Travel advice: The patient and her mother  already did some research and they request malaria prophylaxis. [place] [employer] [place]. They already had hepatitis A immunization, report they don't need typhoid vaccination as they do not plan to eat anywhere but in the hotel. Options discussed  for malaria prophylaxis, mefloquine versus Malarone. Elected Malarone, prescription for #14 provided. General safety discussed.  See AVS

## 2018-03-27 HISTORY — PX: NASAL SEPTUM SURGERY: SHX37

## 2018-06-19 ENCOUNTER — Encounter: Payer: Self-pay | Admitting: Nurse Practitioner

## 2018-06-19 ENCOUNTER — Other Ambulatory Visit: Payer: PRIVATE HEALTH INSURANCE

## 2018-06-19 ENCOUNTER — Ambulatory Visit: Payer: PRIVATE HEALTH INSURANCE | Admitting: Nurse Practitioner

## 2018-06-19 VITALS — BP 140/88 | HR 99 | Temp 98.8°F | Ht 65.5 in | Wt 190.0 lb

## 2018-06-19 DIAGNOSIS — R509 Fever, unspecified: Secondary | ICD-10-CM | POA: Diagnosis not present

## 2018-06-19 DIAGNOSIS — J069 Acute upper respiratory infection, unspecified: Secondary | ICD-10-CM

## 2018-06-19 LAB — POCT INFLUENZA A/B
Influenza A, POC: NEGATIVE
Influenza B, POC: NEGATIVE

## 2018-06-19 LAB — STREP COMPLETE PANEL
Strep Pyogenes: NEGATIVE
Strep dysgalactiae: NEGATIVE

## 2018-06-19 MED ORDER — FLUTICASONE PROPIONATE 50 MCG/ACT NA SUSP: 2.0000 | Freq: Every day | NASAL | 1 refills | Status: DC

## 2018-06-19 NOTE — Patient Instructions (Addendum)
Please head downstairs for lab work. If any of your test results are critically abnormal, you will be contacted right away. Otherwise, I will contact you within a week about your test results and follow up recommendations  For your symptoms:  I have sent a prescription for flonase nasal spray to your pharmacy- you may start with 2 sprays in each nostril daily then reduce to 1 spray in each nostril daily when your symptoms improve You may also take an over the counter allergy medication such as claritin or zyrtec for your symptoms. For nasal and head congestion may take Sudafed PE 10 mg every 4 hour as needed. Saline nasal spray used frequently. Ibuprofen 600 mg every 6 hours as needed for pain, discomfort or fever. Drink plenty of fluids and stay well-hydrated.  Please follow up for fevers over 101, if your symptoms get worse, or if your symptoms are not any better in 1 week.    Upper Respiratory Infection, Adult Most upper respiratory infections (URIs) are caused by a virus. A URI affects the nose, throat, and upper air passages. The most common type of URI is often called "the common cold." Follow these instructions at home:  Take medicines only as told by your doctor.  Gargle warm saltwater or take cough drops to comfort your throat as told by your doctor.  Use a warm mist humidifier or inhale steam from a shower to increase air moisture. This may make it easier to breathe.  Drink enough fluid to keep your pee (urine) clear or pale yellow.  Eat soups and other clear broths.  Have a healthy diet.  Rest as needed.  Go back to work when your fever is gone or your doctor says it is okay. ? You may need to stay home longer to avoid giving your URI to others. ? You can also wear a face mask and wash your hands often to prevent spread of the virus.  Use your inhaler more if you have asthma.  Do not use any tobacco products, including cigarettes, chewing tobacco, or electronic  cigarettes. If you need help quitting, ask your doctor. Contact a doctor if:  You are getting worse, not better.  Your symptoms are not helped by medicine.  You have chills.  You are getting more short of breath.  You have brown or red mucus.  You have yellow or brown discharge from your nose.  You have pain in your face, especially when you bend forward.  You have a fever.  You have puffy (swollen) neck glands.  You have pain while swallowing.  You have white areas in the back of your throat. Get help right away if:  You have very bad or constant: ? Headache. ? Ear pain. ? Pain in your forehead, behind your eyes, and over your cheekbones (sinus pain). ? Chest pain.  You have long-lasting (chronic) lung disease and any of the following: ? Wheezing. ? Long-lasting cough. ? Coughing up blood. ? A change in your usual mucus.  You have a stiff neck.  You have changes in your: ? Vision. ? Hearing. ? Thinking. ? Mood. This information is not intended to replace advice given to you by your health care provider. Make sure you discuss any questions you have with your health care provider. Document Released: 02/21/2008 Document Revised: 05/07/2016 Document Reviewed: 12/10/2013 Elsevier Interactive Patient Education  2018 [employer].

## 2018-06-19 NOTE — Progress Notes (Signed)
Name: Karen Lang   MRN: [id number]    DOB: 05/29/1991   Date:06/19/2018       Progress Note  Subjective  Chief Complaint  Chief Complaint  Patient presents with  . Sinusitis    x 2 days, using sudafed  . Fever    on 06/18/18  . Cough    HPI  Karen Lang is here today requesting evaluation of acute complaint of cold symptoms, first began this past Saturday. She reports fevers, chills,  malaise, headaches, sinus pressure, nasal congestion, sore throat. She denies weakness, confusion, shortness of breath, wheezing, abdominal pain, diarrhea. She has been taking sudafed at home with some temporary relief. She works as an Nutritional therapist, recently had sick coworkers with similar symptoms  Patient Active Problem List   Diagnosis Date Noted  . PCOS (polycystic ovarian syndrome) 07/27/2017  . Family history of glaucoma 06/11/2017  . Glaucoma suspect of both eyes 06/11/2017  . PCP NOTES >>>>>>>>>>>>>>>>>>>>> 09/14/2016  . Annual physical exam 04/23/2014  . Anemia 02/12/2013  . Family history of thyroid disease 02/12/2013  . Allergic rhinitis 02/03/2013    Social History   Tobacco Use  . Smoking status: Never Smoker  . Smokeless tobacco: Never Used  Substance Use Topics  . Alcohol use: No     Current Outpatient Medications:  .  azelastine (ASTELIN) 0.1 % nasal spray, Place 2 sprays into both nostrils at bedtime as needed for rhinitis. Use in each nostril as directed, Disp: 30 mL, Rfl: 3 .  cetirizine (ZYRTEC) 10 MG tablet, Take 1 tablet (10 mg total) by mouth daily., Disp: 30 tablet, Rfl: 2 .  drospirenone-ethinyl estradiol (YASMIN,ZARAH,SYEDA) 3-0.03 MG tablet, Take 1 tablet by mouth daily., Disp: , Rfl:  .  metFORMIN (GLUCOPHAGE) 500 MG tablet, Take by mouth daily., Disp: , Rfl:  .  fluticasone (FLONASE) 50 MCG/ACT nasal spray, Place 2 sprays into both nostrils daily., Disp: 16 g, Rfl: 1  No Known Allergies  ROS  No other specific complaints in a complete review of systems  (except as listed in HPI above).  Objective  Vitals:   06/19/18 1310 06/19/18 1408  BP: 140/88   Pulse: (!) 124 99  Temp: 98.8 F (37.1 C)   TempSrc: Oral   SpO2: 99%   Weight: 190 lb (86.2 kg)   Height: 5' 5.5" (1.664 m)     Body mass index is 31.14 kg/m.  Nursing Note and Vital Signs reviewed.  Physical Exam  Constitutional: Patient appears well-developed and well-nourished.  No distress.  HEENT: head atraumatic, normocephalic, pupils equal and reactive to light, EOM's intact, TM's without erythema or bulging,  no maxillary or frontal sinus tenderness , neck supple without lymphadenopathy, oropharynx pink and moist without exudate Cardiovascular: Normal rate, regular rhythm, S1/S2 present.  Distal pulses intact Pulmonary/Chest: Effort normal and breath sounds clear. No respiratory distress or retractions. Neurological: She is alert and oriented to person, place, and time. No cranial nerve deficit. Coordination, balance, strength, speech and gait are normal.  Skin: Skin is warm and dry. No rash noted. No erythema.  Psychiatric: Patient has a normal mood and affect. behavior is normal. Judgment and thought content normal.    Assessment & Plan  Rapid flu is negative in office Strep ordered Likely viral at this point, she is on about day 4 of symptoms Sent rx for flonase for symptoms-dosing, side effects discussed as well as reviewed home management, Red flags and when to present for emergency care or RTC including  fever >101.28F, chest pain, shortness of breath, new/worsening/un-resolving symptoms,  provided additional information in AVS.  1. Upper respiratory tract infection, unspecified type - fluticasone (FLONASE) 50 MCG/ACT nasal spray; Place 2 sprays into both nostrils daily.  Dispense: 16 g; Refill: 1  2. Fever, unspecified fever cause - Strep Complete Panel; Future - POCT Influenza A/B-negative

## 2018-06-20 ENCOUNTER — Telehealth: Payer: Self-pay | Admitting: Nurse Practitioner

## 2018-06-20 MED ORDER — AMOXICILLIN 875 MG PO TABS: 875.0000 mg | ORAL_TABLET | Freq: Two times a day (BID) | ORAL | 0 refills | Status: DC

## 2018-06-20 NOTE — Telephone Encounter (Signed)
Patient was seen on 10/2 - Please advise.

## 2018-06-20 NOTE — Telephone Encounter (Signed)
I have sent course of amoxicillin to her pharmacy for her Please make sure to change your toothbrush Please follow up with Dr Drue Novel for recurrent strep infections, he may want you to see ENT

## 2018-06-20 NOTE — Telephone Encounter (Signed)
Left detailed message informing pt of below.  

## 2018-06-20 NOTE — Telephone Encounter (Signed)
Copied from CRM [id number]. Topic: Quick Communication - See Telephone Encounter >> Jun 20, 2018  9:02 AM Maia Petties wrote: CRM for notification. See Telephone encounter for: 06/20/18. Last night pt spiked a fever of 101, started diarrhea, and her throat feels like it is on fire. Pt states she tested negative for strep but these are common symptoms for her when she gets it. She's had 4-5x in the past 2 years. Pt is asking if there is something Apolonio Schneiders can do for her before the weekend. Please advise.  [facility] [address] [place], [place] - [address] [phone] (Phone) [phone] (Fax)

## 2018-06-25 ENCOUNTER — Encounter: Payer: Self-pay | Admitting: Internal Medicine

## 2018-06-25 ENCOUNTER — Ambulatory Visit: Payer: PRIVATE HEALTH INSURANCE | Admitting: Internal Medicine

## 2018-06-25 ENCOUNTER — Ambulatory Visit (HOSPITAL_BASED_OUTPATIENT_CLINIC_OR_DEPARTMENT_OTHER)
Admission: RE | Admit: 2018-06-25 | Discharge: 2018-06-25 | Disposition: A | Discharge status: Home / Self Care | Payer: PRIVATE HEALTH INSURANCE | Source: Ambulatory Visit | Attending: Internal Medicine | Admitting: Internal Medicine

## 2018-06-25 VITALS — BP 108/74 | HR 92 | Temp 98.2°F | Resp 16 | Ht 65.5 in | Wt 192.1 lb

## 2018-06-25 DIAGNOSIS — R05 Cough: Secondary | ICD-10-CM | POA: Diagnosis not present

## 2018-06-25 DIAGNOSIS — B349 Viral infection, unspecified: Secondary | ICD-10-CM | POA: Diagnosis not present

## 2018-06-25 LAB — CBC WITH DIFFERENTIAL/PLATELET
BASOS ABS: 0 10*3/uL (ref 0.0–0.1)
Basophils Relative: 0.5 % (ref 0.0–3.0)
Eosinophils Absolute: 0.2 10*3/uL (ref 0.0–0.7)
Eosinophils Relative: 2.7 % (ref 0.0–5.0)
HCT: 43.1 % (ref 36.0–46.0)
Hemoglobin: 14.7 g/dL (ref 12.0–15.0)
LYMPHS ABS: 1.8 10*3/uL (ref 0.7–4.0)
Lymphocytes Relative: 28.8 % (ref 12.0–46.0)
MCHC: 34.1 g/dL (ref 30.0–36.0)
MCV: 85.1 fl (ref 78.0–100.0)
MONO ABS: 0.3 10*3/uL (ref 0.1–1.0)
MONOS PCT: 5.4 % (ref 3.0–12.0)
NEUTROS ABS: 3.9 10*3/uL (ref 1.4–7.7)
NEUTROS PCT: 62.6 % (ref 43.0–77.0)
PLATELETS: 287 10*3/uL (ref 150.0–400.0)
RBC: 5.06 Mil/uL (ref 3.87–5.11)
RDW: 12.9 % (ref 11.5–15.5)
WBC: 6.3 10*3/uL (ref 4.0–10.5)

## 2018-06-25 LAB — BASIC METABOLIC PANEL
BUN: 10 mg/dL (ref 6–23)
CALCIUM: 9.5 mg/dL (ref 8.4–10.5)
CO2: 27 meq/L (ref 19–32)
Chloride: 104 mEq/L (ref 96–112)
Creatinine, Ser: 0.83 mg/dL (ref 0.40–1.20)
GFR: 89.02 mL/min (ref >60.00)
GLUCOSE: 90 mg/dL (ref 70–99)
Potassium: 4.7 mEq/L (ref 3.5–5.1)
SODIUM: 139 meq/L (ref 135–145)

## 2018-06-25 MED ORDER — ALBUTEROL SULFATE HFA 108 (90 BASE) MCG/ACT IN AERS: 2.0000 | INHALATION_SPRAY | Freq: Four times a day (QID) | RESPIRATORY_TRACT | 1 refills | Status: DC | PRN

## 2018-06-25 NOTE — Patient Instructions (Addendum)
Get your labs   Get the XR    Rest, fluids , tylenol  For cough:  Take Mucinex DM twice a day as needed until better  Use albuterol if chest tightness, severe cough, wheezing   For nasal congestion:  Flonase : 2 nasal sprays on each side of the nose in the morning until you feel better Use ASTELIN a prescribed spray : 2 nasal sprays on each side of the nose at night until you feel better   Call if not gradually better over the next  10 days  Call anytime if the symptoms are severe

## 2018-06-25 NOTE — Progress Notes (Signed)
Subjective:    Patient ID: Karen Lang, female    DOB: 1991-04-04, 27 y.o.   MRN: [id number]  DOS:  06/25/2018 Type of visit - description : Acute visit Interval history: Symptoms started 10 days ago, she felt she was "hit by a truck" with aches, pains and severe tiredness. She also had severe sore throat, cough, chest tightness and congestion, no wheezing. Went to another practice, flu test and strep test were negative. As she was not feeling better, she was prescribed antibiotics. She continued to feel poorly, her last fever measured at 100.1 was on 06/23/2018.  She is here because she continue with sore throat, cough, she has a leftover albuterol and seems like it is helping with a cough and chest tightness.  No history of asthma per se.   Review of Systems  Denies headaches, rash. Nasal congestion has decreased a little, she still has some postnasal dripping. Had diarrhea x2, that is also better.  Past Medical History:  Diagnosis Date  . Acne   . PCOS (polycystic ovarian syndrome) 07/27/2017    Past Surgical History:  Procedure Laterality Date  . NO PAST SURGERIES      Social History   Socioeconomic History  . Marital status: Single    Spouse name: Not on file  . Number of children: 0  . Years of education: Not on file  . Highest education level: Not on file  Occupational History  . 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 Financial resource strain: Not on file  . Food insecurity:    Worry: Not on file    Inability: Not on file  . Transportation needs:    Medical: Not on file    Non-medical: Not on file  Tobacco Use  . Smoking status: Never Smoker  . Smokeless tobacco: Never Used  Substance and Sexual Activity  . Alcohol use: No  . Drug use: No  . Sexual activity: Not on file  Lifestyle  . Physical activity:    Days per week: Not on file    Minutes per session: Not on file  . Stress: Not on file  Relationships  . Social connections:    Talks on  phone: Not on file    Gets together: Not on file    Attends religious service: Not on file    Active member of club or organization: Not on file    Attends meetings of clubs or organizations: Not on file    Relationship status: Not on file  . Intimate partner violence:    Fear of current or ex partner: Not on file    Emotionally abused: Not on file    Physically abused: Not on file    Forced sexual activity: Not on file  Other Topics Concern  . 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 Always use your most recent med list.          amoxicillin 875 MG tablet Commonly known as:  AMOXIL Take 1 tablet (875 mg total) by mouth 2 (two) times daily.   azelastine 0.1 % nasal spray Commonly known as:  ASTELIN Place 2 sprays into both nostrils at bedtime as needed for rhinitis. Use in each  nostril as directed   cetirizine 10 MG tablet Commonly known as:  ZYRTEC Take 1 tablet (10 mg total) by mouth daily.   drospirenone-ethinyl estradiol 3-0.03 MG tablet Commonly known as:  YASMIN,ZARAH,SYEDA Take 1 tablet by mouth daily.   fluticasone 50 MCG/ACT nasal spray Commonly known as:  FLONASE Place 2 sprays into both nostrils daily.   metFORMIN 500 MG tablet Commonly known as:  GLUCOPHAGE Take by mouth daily.          Objective:   Physical Exam BP 108/74 (BP Location: Left Arm, Patient Position: Sitting, Cuff Size: Normal)   Pulse 92   Temp 98.2 F (36.8 C) (Oral)   Resp 16   Ht 5' 5.5" (1.664 m)   Wt 192 lb 2 oz (87.1 kg)   LMP 06/25/2018 (Exact Date)   SpO2 97%   BMI 31.49 kg/m  General:   Well developed, NAD, see BMI.  HEENT:  Normocephalic . Face symmetric, atraumatic.  TMs normal, throat symmetric, no red.  Nose congested, sinuses no TTP Lungs:  Very few rhonchi bilaterally, no wheezing. Normal  respiratory effort, no intercostal retractions, no accessory muscle use. Heart: RRR,  no murmur.  No pretibial edema bilaterally  Skin: Not pale. Not jaundice Neurologic:  alert & oriented X3.  Speech normal, gait appropriate for age and unassisted Psych--  Cognition and judgment appear intact.  Cooperative with normal attention span and concentration.  Behavior appropriate. No anxious or depressed appearing.      Assessment & Plan:   Assessment Allergies Acne PCOS dx 2018  Plan: Viral syndrome: Respiratory symptoms and fever for 10 days, last fever was 3 days ago, still not feeling well.  Flu test and strep test negative, on empiric amoxicillin. Patient is quite concerned about her symptoms, to be sure we will check a chest x-ray, CBC and BMP. Continue with supportive treatment including albuterol (reactive airway disease?). I am okay her stopping amoxicillin.  Doubt strep. Call if not gradually improving

## 2018-06-25 NOTE — Progress Notes (Signed)
Pre visit review using our clinic review tool, if applicable. No additional management support is needed unless otherwise documented below in the visit note. 

## 2018-06-26 NOTE — Assessment & Plan Note (Signed)
Viral syndrome: Respiratory symptoms and fever for 10 days, last fever was 3 days ago, still not feeling well.  Flu test and strep test negative, on empiric amoxicillin. Patient is quite concerned about her symptoms, to be sure we will check a chest x-ray, CBC and BMP. Continue with supportive treatment including albuterol (reactive airway disease?). I am okay her stopping amoxicillin.  Doubt strep. Call if not gradually improving

## 2018-08-12 ENCOUNTER — Ambulatory Visit (INDEPENDENT_AMBULATORY_CARE_PROVIDER_SITE_OTHER): Payer: PRIVATE HEALTH INSURANCE | Admitting: Internal Medicine

## 2018-08-12 ENCOUNTER — Encounter: Payer: Self-pay | Admitting: Internal Medicine

## 2018-08-12 VITALS — BP 120/72 | HR 76 | Temp 98.2°F | Resp 16 | Ht 66.0 in | Wt 193.0 lb

## 2018-08-12 DIAGNOSIS — R251 Tremor, unspecified: Secondary | ICD-10-CM | POA: Diagnosis not present

## 2018-08-12 DIAGNOSIS — Z0000 Encounter for general adult medical examination without abnormal findings: Secondary | ICD-10-CM | POA: Diagnosis not present

## 2018-08-12 LAB — LIPID PANEL
CHOLESTEROL: 153 mg/dL (ref 0–200)
HDL: 53.8 mg/dL (ref >39.00)
LDL Cholesterol: 85 mg/dL (ref 0–99)
NONHDL: 98.96
TRIGLYCERIDES: 72 mg/dL (ref 0.0–149.0)
Total CHOL/HDL Ratio: 3
VLDL: 14.4 mg/dL (ref 0.0–40.0)

## 2018-08-12 LAB — HEPATIC FUNCTION PANEL
ALBUMIN: 4.1 g/dL (ref 3.5–5.2)
ALT: 18 U/L (ref 0–35)
AST: 16 U/L (ref 0–37)
Alkaline Phosphatase: 43 U/L (ref 39–117)
BILIRUBIN TOTAL: 0.3 mg/dL (ref 0.2–1.2)
Bilirubin, Direct: 0 mg/dL (ref 0.0–0.3)
Total Protein: 7.1 g/dL (ref 6.0–8.3)

## 2018-08-12 NOTE — Progress Notes (Signed)
Subjective:    Patient ID: Karen Lang, female    DOB: Feb 21, 1991, 27 y.o.   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 sxs are not interfering with her job. Slightly more noticeable if she is anxious, during the weekend had 1 glass of wine and symptoms decreased. No headache, nausea, vomiting, paresthesias. Recent eye check normal. No neck pain.   Review of Systems  Other than above, a 14 point review of systems is negative      Past Medical History:  Diagnosis Date  . Acne   . Allergic rhinitis   . Closed fracture of nasal bone   . Glaucoma suspect of both eyes   . Headache   . PCOS (polycystic ovarian syndrome) 07/27/2017    Past Surgical History:  Procedure Laterality Date  . NASAL SEPTUM SURGERY  03/27/2018   R inferior turbinate outfracture    Social History   Socioeconomic History  . Marital status: Single    Spouse name: Not on file  . Number of children: 0  . Years of education: Not on file  . Highest education level: Not on file  Occupational History  . 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 Financial resource strain: Not on file  . Food insecurity:    Worry: Not on file    Inability: Not on file  . Transportation needs:    Medical: Not on file    Non-medical: Not on file  Tobacco Use  . Smoking status: Never Smoker  . Smokeless tobacco: Never Used  Substance and Sexual Activity  . Alcohol use: No  . Drug use: No  . Sexual activity: Not on file  Lifestyle  . Physical activity:    Days per week: Not on file    Minutes per session: Not on file  . Stress: Not on file  Relationships  . Social connections:    Talks on phone: Not on file    Gets together: Not on file    Attends religious service: Not on file    Active member of club or organization: Not on file    Attends  meetings of clubs or organizations: Not on file    Relationship status: Not on file  . Intimate partner violence:    Fear of current or ex partner: Not on file    Emotionally abused: Not on file    Physically abused: Not on file    Forced sexual activity: Not on file  Other Topics Concern  . 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 CAD Father 48       stents  . Thyroid disease Father         thyroid tumor  . Breast cancer Maternal Grandfather   . Colon cancer Neg Hx   . Diabetes Neg Hx   . Hypertension Neg Hx      Allergies as of 08/12/2018   No Known Allergies     Medication List        Accurate as of 08/12/18 11:59 PM. Always use your most recent med list.          albuterol 108 (  90 Base) MCG/ACT inhaler Commonly known as:  PROVENTIL HFA;VENTOLIN HFA Inhale 2 puffs into the lungs every 6 (six) hours as needed for wheezing or shortness of breath.   cetirizine 10 MG tablet Commonly known as:  ZYRTEC Take 1 tablet (10 mg total) by mouth daily.   LARIN FE 1.5/30 1.5-30 MG-MCG tablet Generic drug:  norethindrone-ethinyl estradiol-iron Take 1 tablet by mouth daily.   metFORMIN 500 MG tablet Commonly known as:  GLUCOPHAGE Take by mouth daily.           Objective:   Physical Exam BP 120/72 (BP Location: Left Arm, Patient Position: Sitting, Cuff Size: Small)   Pulse 76   Temp 98.2 F (36.8 C) (Oral)   Resp 16   Ht 5\' 6"  (1.676 m)   Wt 193 lb (87.5 kg)   LMP 07/29/2018 (Approximate)   SpO2 98%   BMI 31.15 kg/m  General: Well developed, NAD, BMI noted Neck: No  thyromegaly  HEENT:  Normocephalic . Face symmetric, atraumatic Lungs:  CTA B Normal respiratory effort, no intercostal retractions, no accessory muscle use. Heart: RRR,  no murmur.  No pretibial edema bilaterally  Abdomen:  Not distended, soft, non-tender. No rebound or rigidity.   Skin:  Exposed areas without rash. Not pale. Not jaundice Neurologic:  alert & oriented X3.  Speech normal, gait appropriate for age and unassisted Strength symmetric and appropriate for age. Motor exam: Very subtle tremor Psych: Cognition and judgment appear intact.  Cooperative with normal attention span and concentration. The gait Behavior appropriate. No anxious or depressed appearing.     Assessment & Plan:    Assessment Allergies and RAD Acne PCOS dx 2018 (Dr Talmage NapBalan)  PLAN: Here for CPX, doing great. Right hand tremor: Very subtle, sounds like essential tremor.  Given her young age I would like Dr. Arbutus Leasat to see her and evaluate.  Referral sent. Patient in agreement. RTC 1 year

## 2018-08-12 NOTE — Assessment & Plan Note (Signed)
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

## 2018-08-12 NOTE — Progress Notes (Signed)
Pre visit review using our clinic review tool, if applicable. No additional management support is needed unless otherwise documented below in the visit note. 

## 2018-08-12 NOTE — Patient Instructions (Signed)
GO TO THE LAB : Get the blood work     GO TO THE FRONT DESK Schedule your next appointment for a  Physical exam in 1 year 

## 2018-08-13 NOTE — Assessment & Plan Note (Signed)
Here for CPX, doing great. Right hand tremor: Very subtle, sounds like essential tremor.  Given her young age I would like Dr. Arbutus Leasat to see her and evaluate.  Referral sent. Patient in agreement. RTC 1 year

## 2018-08-19 ENCOUNTER — Encounter: Payer: Self-pay | Admitting: Neurology

## 2018-08-22 NOTE — Progress Notes (Signed)
Subjective:   Karen Lang was seen in consultation in the movement disorder clinic at the request of Wanda Plump, MD.  The evaluation is for tremor.  Tremor started approximately 2 months ago and involves the bilateral UE.  She thought that it was initially related to the metformin and took herself it and it didn't change it so she went back on it.  She feels like it may be related to anxiety although really hasn't been an anxious person in the past.  She is in the middle of applying to grad school and works in a stressful environment.  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   There is no family hx of tremor.    Affected by caffeine:  No. (1 cup of coffee) Affected by alcohol:  Yes.   Affected by stress:  Yes.   Affected by fatigue:  No. Spills soup if on spoon:  No. Spills glass of liquid if full:  No. Affects ADL's (tying shoes, brushing teeth, etc):  No.  Current/Previously tried tremor medications: n/a  Current medications that may exacerbate tremor:  Albuterol (rarely - hasn't used in over a month)  Outside reports reviewed: lab reports and office notes.  No Known Allergies  Outpatient Encounter Medications as of 08/26/2018  Medication Sig  . albuterol (VENTOLIN HFA) 108 (90 Base) MCG/ACT inhaler Inhale 2 puffs into the lungs every 6 (six) hours as needed for wheezing or shortness of breath.  . cetirizine (ZYRTEC) 10 MG tablet Take 1 tablet (10 mg total) by mouth daily.  . metFORMIN (GLUCOPHAGE) 500 MG tablet Take by mouth daily.  . norethindrone-ethinyl estradiol-iron (LARIN FE 1.5/30) 1.5-30 MG-MCG tablet Take 1 tablet by mouth daily.   No facility-administered encounter medications on file as of 08/26/2018.     Past Medical History:  Diagnosis Date  . Acne   . Allergic rhinitis   . Closed fracture of nasal bone   . Glaucoma suspect of both eyes   . Headache   . PCOS (polycystic ovarian syndrome) 07/27/2017    Past Surgical History:    Procedure Laterality Date  . NASAL SEPTUM SURGERY  03/27/2018   R inferior turbinate outfracture    Social History   Socioeconomic History  . Marital status: Single    Spouse name: Not on file  . Number of children: 0  . Years of education: Not on file  . Highest education level: Not on file  Occupational History  . 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 Financial resource strain: Not on file  . Food insecurity:    Worry: Not on file    Inability: Not on file  . Transportation needs:    Medical: Not on file    Non-medical: Not on file  Tobacco Use  . Smoking status: Never Smoker  . Smokeless tobacco: Never Used  Substance and Sexual Activity  . Alcohol use: No  . Drug use: No  . Sexual activity: Not on file  Lifestyle  . Physical activity:    Days per week: Not on file    Minutes per session: Not on file  . Stress: Not on file  Relationships  . Social connections:    Talks on phone: Not on file    Gets together: Not on file    Attends religious service: Not on file    Active member of club or organization: Not on file    Attends meetings of clubs or  organizations: Not on file    Relationship status: Not on file  . Intimate partner violence:    Fear of current or ex partner: Not on file    Emotionally abused: Not on file    Physically abused: Not on file    Forced sexual activity: Not on file  Other Topics Concern  . 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 Father  Alive  . MGF  Deceased  . Mother  Alive  . Sister  Alive  . Neg Hx  (Not Specified)    Review of Systems Review of Systems  Constitutional: Negative.   HENT: Negative.   Eyes: Negative.   Respiratory: Negative.   Cardiovascular: Negative.   Gastrointestinal: Negative.   Genitourinary: Negative.   Musculoskeletal: Negative.   Skin: Negative.   Neurological: Negative.   Endo/Heme/Allergies:  Negative.   Psychiatric/Behavioral: Negative.      Objective:   VITALS:   Vitals:   08/26/18 0913  BP: 120/78  Pulse: 88  SpO2: 98%  Weight: 195 lb (88.5 kg)  Height: 5\' 5"  (1.651 m)   Gen:  Appears stated age and in NAD. HEENT:  Normocephalic, atraumatic. The mucous membranes are moist. The superficial temporal arteries are without ropiness or tenderness. Cardiovascular: Regular rate and rhythm. Lungs: Clear to auscultation bilaterally. Neck: There are no carotid bruits noted bilaterally.  NEUROLOGICAL:  Orientation:  The patient is alert and oriented x 3.  Recent and remote memory are intact.  Attention span and concentration are normal.  Able to name objects and repeat without trouble.  Fund of knowledge is appropriate Cranial nerves: There is good facial symmetry. The pupils are equal round and reactive to light bilaterally. Fundoscopic exam reveals clear disc margins bilaterally. Extraocular muscles are intact and visual fields are full to confrontational testing. Speech is fluent and clear. Soft palate rises symmetrically and there is no tongue deviation. Hearing is intact to conversational tone. Tone: Tone is good throughout. Sensation: Sensation is intact to light touch and pinprick throughout (facial, trunk, extremities). Vibration is intact at the bilateral big toe. There is no extinction with double simultaneous stimulation. There is no sensory dermatomal level identified. Coordination:  The patient has no dysdiadichokinesia or dysmetria. Motor: Strength is 5/5 in the bilateral upper and lower extremities.  Shoulder shrug is equal bilaterally.  There is no pronator drift.  There are no fasciculations noted. DTR's: Deep tendon reflexes are 1+-2-/4 at the bilateral biceps, triceps, brachioradialis, patella and achilles.  Plantar responses are downgoing bilaterally. Gait and Station: The patient is able to ambulate without difficulty. The patient is able to heel toe walk without  any difficulty. The patient is able to ambulate in a tandem fashion. The patient is able to stand in the Romberg position.   MOVEMENT EXAM: Tremor:  There is no rest tremor.  There is really no postural tremor.  There is very little intention tremor.  She has no trouble with Archimedes spirals.  Very little tremor is evident when she pours water from one glass to another and she does not spill any of the water.  Lab Results  Component Value Date   TSH 2.91 05/30/2017     Chemistry      Component Value Date/Time   NA 139 06/25/2018 1059   NA 138 05/30/2017   K 4.7 06/25/2018 1059  CL 104 06/25/2018 1059   CO2 27 06/25/2018 1059   BUN 10 06/25/2018 1059   BUN 10 05/30/2017   CREATININE 0.83 06/25/2018 1059   CREATININE 0.96 06/09/2016 1618   GLU 81 05/30/2017      Component Value Date/Time   CALCIUM 9.5 06/25/2018 1059   ALKPHOS 43 08/12/2018 0916   AST 16 08/12/2018 0916   ALT 18 08/12/2018 0916   BILITOT 0.3 08/12/2018 0916          Assessment/Plan:   1.  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  Discussed that anxiety does pick increase most types of tremor.  She is under more stress than usual.  She really does not want medication, nor do I recommend any.  If she finds that she needs an as needed medication, then we could certainly try propranolol.  Nature and pathophysiology were discussed.  She will follow-up on an as-needed basis.  CC:  Wanda PlumpPaz, Jose E, MD

## 2018-08-26 ENCOUNTER — Ambulatory Visit (INDEPENDENT_AMBULATORY_CARE_PROVIDER_SITE_OTHER): Payer: PRIVATE HEALTH INSURANCE | Admitting: Neurology

## 2018-08-26 ENCOUNTER — Encounter: Payer: Self-pay | Admitting: Neurology

## 2018-08-26 VITALS — BP 120/78 | HR 88 | Ht 65.0 in | Wt 195.0 lb

## 2018-08-26 DIAGNOSIS — R251 Tremor, unspecified: Secondary | ICD-10-CM

## 2019-01-20 IMAGING — CR DG CHEST 2V
2 series · 2 of 2 positions shown · non-contrast
Comparison: None.

CLINICAL DATA: Productive cough for several days

EXAM:
CHEST - 2 VIEW

[w chest pa]
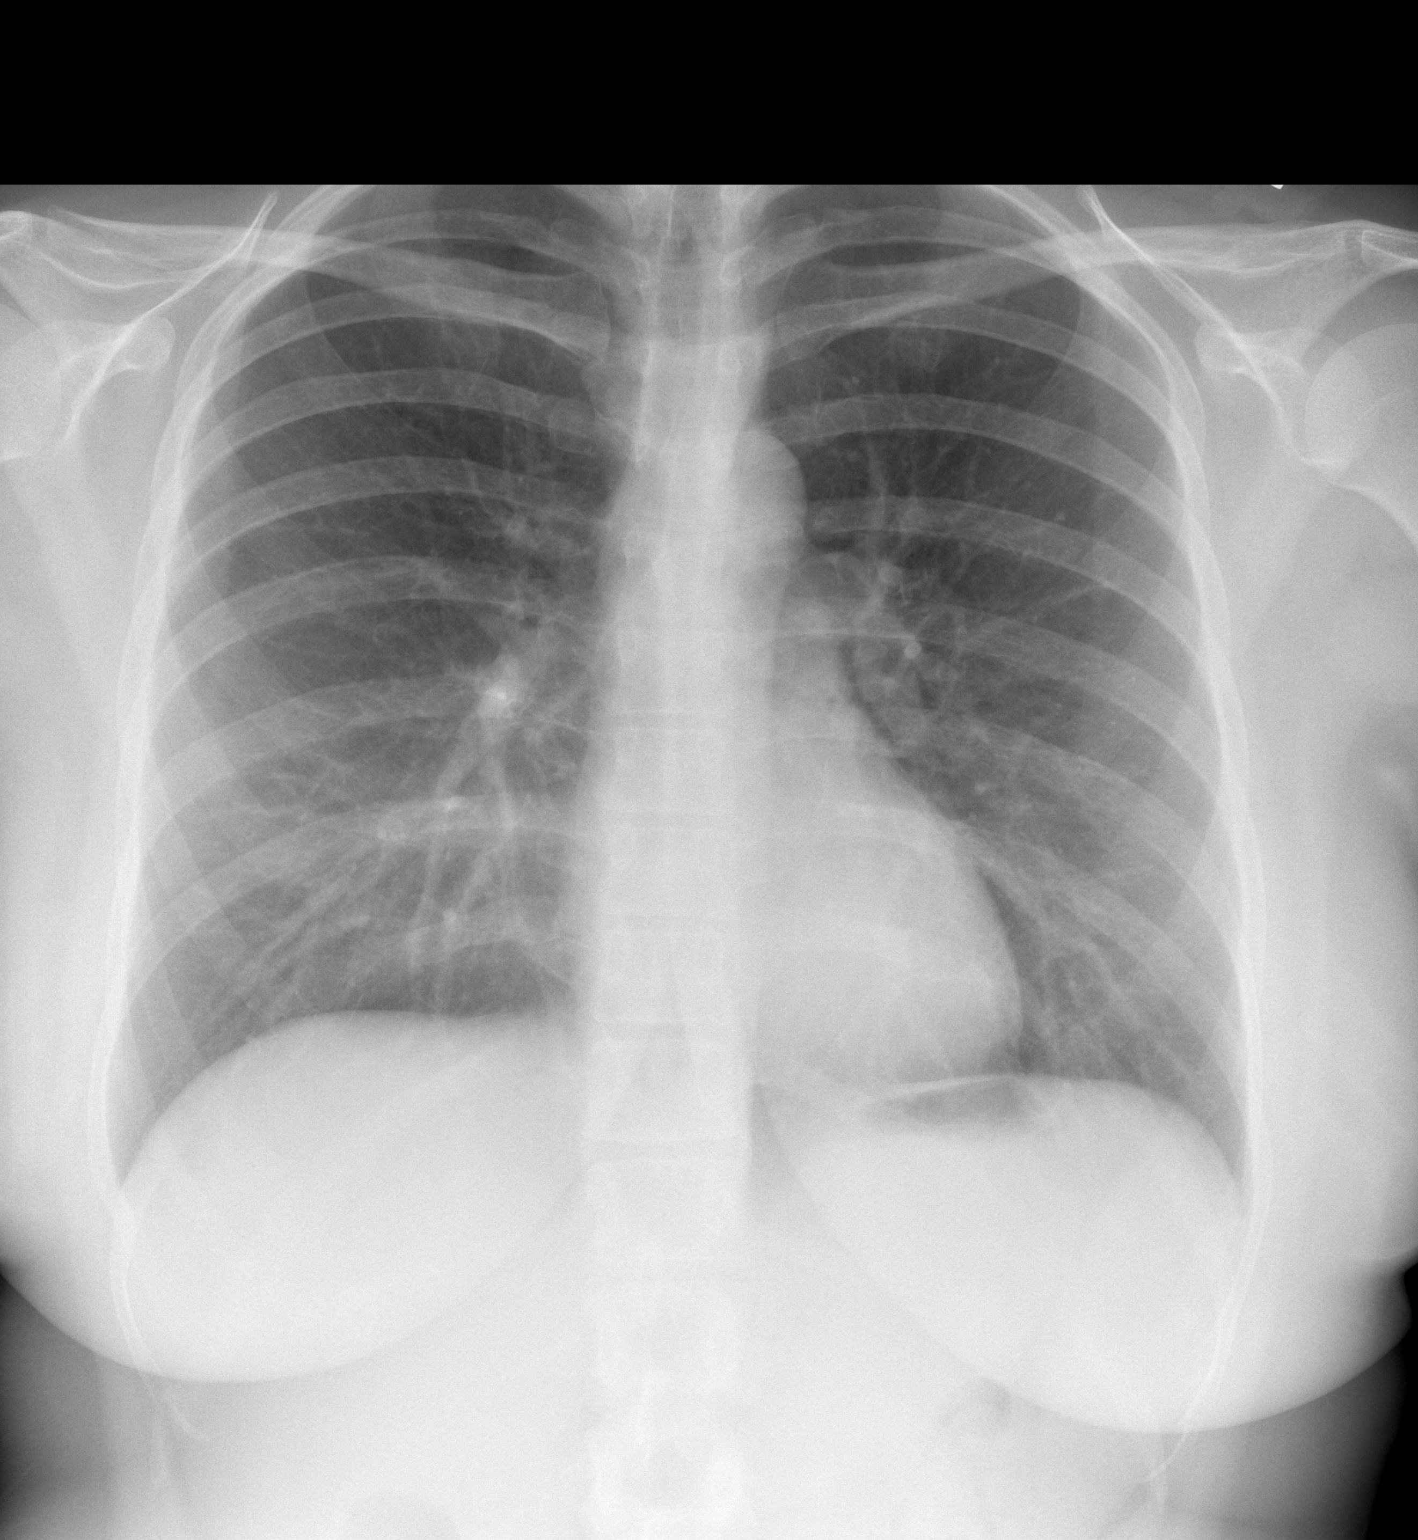

[w chest lat]
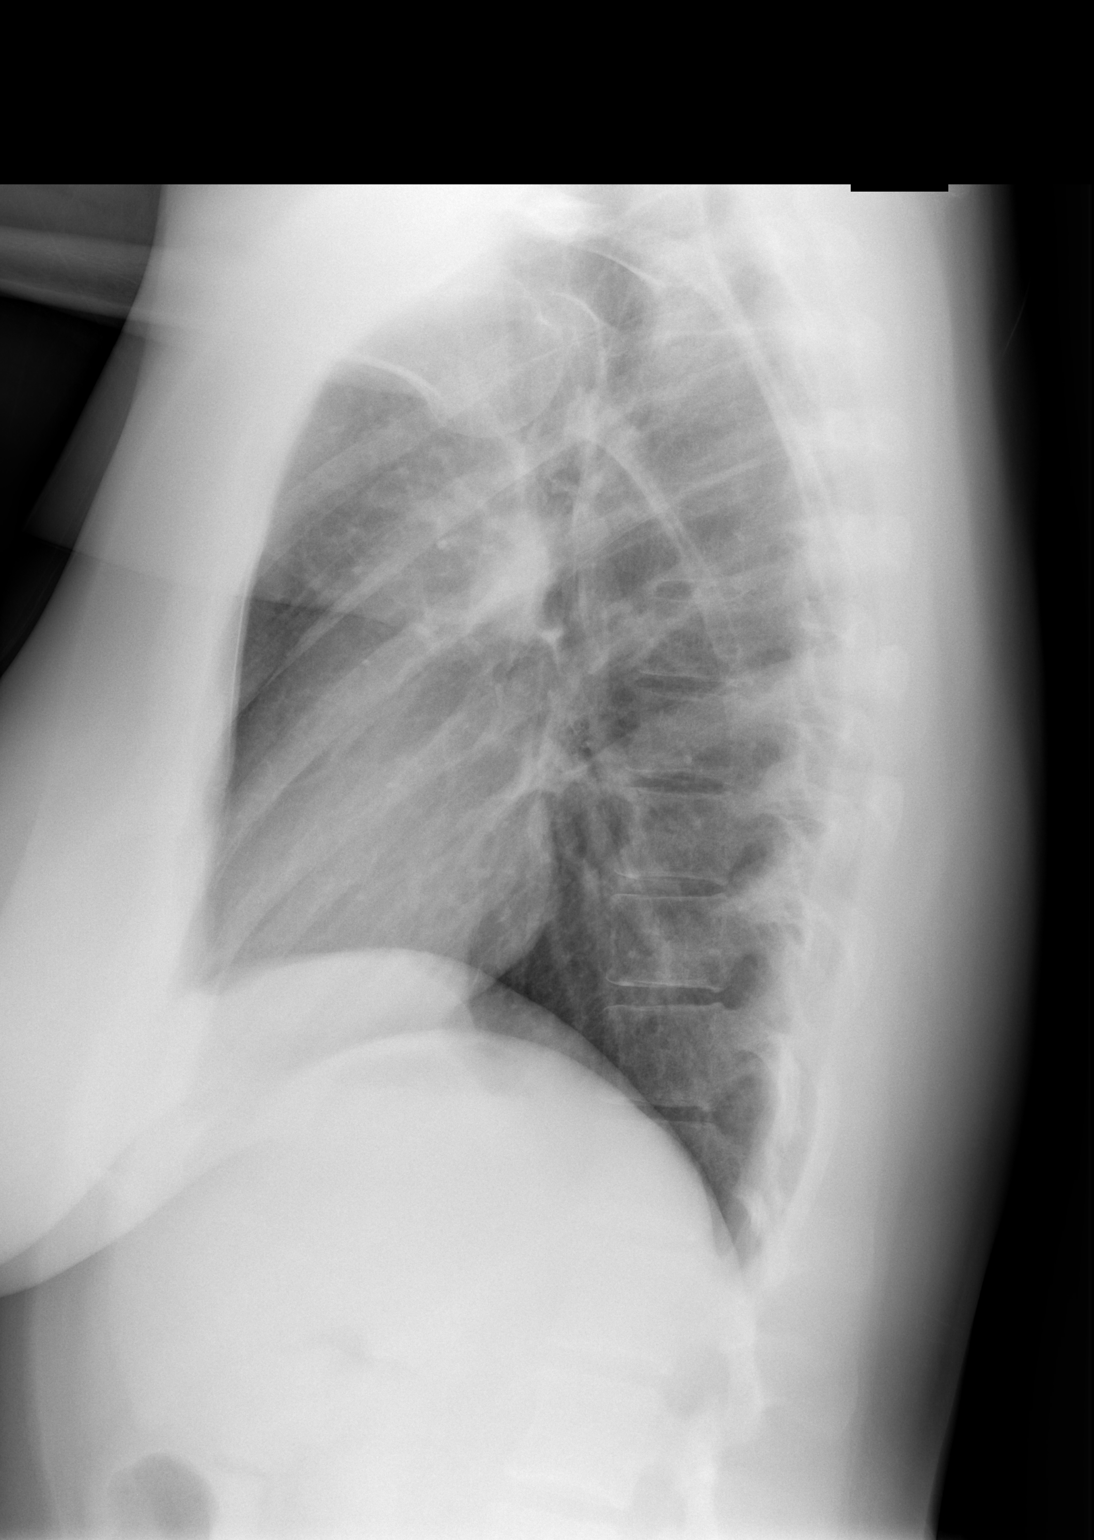

[2 of 2 positions shown; findings below may reference images not displayed]

FINDINGS: The heart size and mediastinal contours are within normal limits.
Both lungs are clear. The visualized skeletal structures are
unremarkable.
IMPRESSION: No active cardiopulmonary disease.

## 2019-08-20 ENCOUNTER — Encounter: Payer: PRIVATE HEALTH INSURANCE | Admitting: Internal Medicine

## 2019-10-08 ENCOUNTER — Other Ambulatory Visit: Payer: Self-pay

## 2019-10-08 LAB — HM PAP SMEAR: HM Pap smear: NEGATIVE

## 2019-10-09 ENCOUNTER — Ambulatory Visit: Payer: PRIVATE HEALTH INSURANCE | Admitting: Internal Medicine

## 2019-10-09 ENCOUNTER — Encounter: Payer: Self-pay | Admitting: Internal Medicine

## 2019-10-09 VITALS — BP 128/90 | HR 108 | Temp 96.8°F | Resp 16 | Ht 66.0 in | Wt 200.2 lb

## 2019-10-09 DIAGNOSIS — F419 Anxiety disorder, unspecified: Secondary | ICD-10-CM

## 2019-10-09 MED ORDER — ESCITALOPRAM OXALATE 10 MG PO TABS: 10.0000 mg | ORAL_TABLET | Freq: Every day | ORAL | 1 refills | Status: DC

## 2019-10-09 MED ORDER — ZOLPIDEM TARTRATE 10 MG PO TABS: 5.0000 mg | ORAL_TABLET | Freq: Every evening | ORAL | 0 refills | Status: DC | PRN

## 2019-10-09 NOTE — Progress Notes (Signed)
   Subjective:    Patient ID: Karen Lang, female    DOB: [birth year], 29 y.o.   MRN: [id number]  DOS:  10/09/2019 Type of visit - description: Acute.  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 She become anxious, unable to sleep well, had palpitations at night, decreased focus. Her right hand shaking increased. She cut hours on her job to be able to have more hours to study but that has not helped much.  Denies fever chills No weight loss No nausea, vomiting, diarrhea No depression per se No suicidal ideas.    Review of Systems See above   Past Medical History:  Diagnosis Date  . Acne   . Allergic rhinitis   . Closed fracture of nasal bone   . Deviated nasal septum    traumatic  . Glaucoma suspect   . Glaucoma suspect of both eyes   . Headache   . PCOS (polycystic ovarian syndrome) 07/27/2017    Past Surgical History:  Procedure Laterality Date  . NASAL SEPTUM SURGERY  03/27/2018   R inferior turbinate outfracture        Objective:   Physical Exam BP 128/90 (BP Location: Left Arm, Patient Position: Sitting, Cuff Size: Normal)   Pulse (!) 108   Temp (!) 96.8 F (36 C) (Temporal)   Resp 16   Ht 5\' 6"  (1.676 m)   Wt 200 lb 4 oz (90.8 kg)   LMP 09/07/2019   SpO2 100%   BMI 32.32 kg/m  General:   Well developed, NAD, BMI noted. HEENT:  Normocephalic . Face symmetric, atraumatic Skin: Not pale. Not jaundice Neurologic:  alert & oriented X3.  Speech normal, gait appropriate for age and unassisted Psych--  Cognition and judgment appear intact.  Cooperative with normal attention span and concentration.  Behavior appropriate.  slightly anxious appearing.  Tearful, slightly depressed appearing     Assessment    Assessment Allergies and RAD Acne PCOS dx 2018 (Dr 2019) Right hand tremor: Physiologic tremor per neurology   visit 08/2018   PLAN: Anxiety: New  issue. Situational anxiety, see above. She denies depression although the PHQ-9 score is 13. She is counseled today. The tools that she has to control symptoms are: Increase exercise, counseling and medication. She is ready for medicines.  We will start Lexapro daily and Ambien as needed only for insomnia. Suicidal ideas and side effect discussed.  She will call if she has side effects. We will do labs including a TSH on RTC. RTC 4 to 6 weeks CPX  This visit occurred during the SARS-CoV-2 public health emergency.  Safety protocols were in place, including screening questions prior to the visit, additional usage of staff PPE, and extensive cleaning of exam room while observing appropriate contact time as indicated for disinfecting solutions.

## 2019-10-09 NOTE — Patient Instructions (Addendum)
  GO TO THE FRONT DESK Next visit in 4 to 6 weeks for a physical exam, please schedule an appointment  Start escitalopram 10 mg at nighttime  Take zolpidem either half or 1 tablet at bedtime if you cannot sleep      Please see a counselor

## 2019-10-09 NOTE — Progress Notes (Signed)
Pre visit review using our clinic review tool, if applicable. No additional management support is needed unless otherwise documented below in the visit note. 

## 2019-10-10 NOTE — Assessment & Plan Note (Signed)
  Anxiety: New issue. Situational anxiety, see above. She denies depression although the PHQ-9 score is 13. She is counseled today. The tools that she has to control symptoms are: Increase exercise, counseling and medication. She is ready for medicines.  We will start Lexapro daily and Ambien as needed only for insomnia. Suicidal ideas and side effect discussed.  She will call if she has side effects. We will do labs including a TSH on RTC. RTC 4 to 6 weeks CPX

## 2019-10-16 LAB — BASIC METABOLIC PANEL
BUN: 7 (ref 4–21)
CO2: 22 (ref 13–22)
Chloride: 104 (ref 99–108)
Creatinine: 0.8 (ref 0.5–1.1)
Glucose: 89
Potassium: 4.3 (ref 3.4–5.3)
Sodium: 139 (ref 137–147)

## 2019-10-16 LAB — COMPREHENSIVE METABOLIC PANEL
Calcium: 8.8 (ref 8.7–10.7)
GFR calc Af Amer: 110.93
GFR calc non Af Amer: 91.68

## 2019-10-16 LAB — HEMOGLOBIN A1C: Hemoglobin A1C: 4.7

## 2019-10-16 LAB — TSH: TSH: 1.56 (ref 0.41–5.90)

## 2019-11-05 ENCOUNTER — Telehealth: Payer: Self-pay | Admitting: Internal Medicine

## 2019-11-05 NOTE — Telephone Encounter (Signed)
RF sent.

## 2019-11-05 NOTE — Telephone Encounter (Signed)
Ambien refill.   Last OV: 10/09/2019 Last Fill: 10/09/2019 #30 and 0RF

## 2019-11-14 ENCOUNTER — Encounter: Payer: Self-pay | Admitting: Internal Medicine

## 2019-11-14 ENCOUNTER — Other Ambulatory Visit: Payer: Self-pay

## 2019-11-14 ENCOUNTER — Ambulatory Visit (INDEPENDENT_AMBULATORY_CARE_PROVIDER_SITE_OTHER): Payer: PRIVATE HEALTH INSURANCE | Admitting: Internal Medicine

## 2019-11-14 VITALS — BP 133/97 | HR 89 | Temp 97.3°F | Resp 16 | Ht 66.0 in | Wt 199.4 lb

## 2019-11-14 DIAGNOSIS — Z0000 Encounter for general adult medical examination without abnormal findings: Secondary | ICD-10-CM | POA: Diagnosis not present

## 2019-11-14 DIAGNOSIS — Z111 Encounter for screening for respiratory tuberculosis: Secondary | ICD-10-CM | POA: Diagnosis not present

## 2019-11-14 NOTE — Progress Notes (Signed)
Subjective:    Patient ID: Karen Lang, female    DOB: 12-10-1990, 29 y.o.   MRN: [id number]  DOS:  11/14/2019 Type of visit - description: CPX Since the last office visit she is doing great  BP Readings from Last 3 Encounters:  11/14/19 (!) 133/97  10/09/19 128/90  08/26/18 120/78    Review of Systems   A 14 point review of systems is negative    Past Medical History:  Diagnosis Date  . Acne   . Allergic rhinitis   . Closed fracture of nasal bone   . Deviated nasal septum    traumatic  . Glaucoma suspect   . Glaucoma suspect of both eyes   . Headache   . PCOS (polycystic ovarian syndrome) 07/27/2017    Past Surgical History:  Procedure Laterality Date  . NASAL SEPTUM SURGERY  03/27/2018   R inferior turbinate outfracture   Family History  Problem Relation Age of Onset  . CAD Father 48       stents  . Thyroid disease Father         thyroid tumor  . Anxiety disorder Father   . Depression Father   . Breast cancer Maternal Grandfather   . Depression Mother   . Anxiety disorder Mother   . Healthy Sister   . Colon cancer Neg Hx   . Diabetes Neg Hx   . Hypertension Neg Hx     Allergies as of 11/14/2019   No Known Allergies     Medication List       Accurate as of November 14, 2019 11:59 PM. If you have any questions, ask your nurse or doctor.        albuterol 108 (90 Base) MCG/ACT inhaler Commonly known as: Ventolin HFA Inhale 2 puffs into the lungs every 6 (six) hours as needed for wheezing or shortness of breath.   cetirizine 10 MG tablet Commonly known as: ZYRTEC Take 1 tablet (10 mg total) by mouth daily.   escitalopram 10 MG tablet Commonly known as: LEXAPRO Take 1 tablet (10 mg total) by mouth daily.   metFORMIN 500 MG tablet Commonly known as: GLUCOPHAGE Take by mouth daily.   Ocella 3-0.03 MG tablet Generic drug: drospirenone-ethinyl estradiol Take 1 tablet by mouth daily.   zolpidem 10 MG tablet Commonly known as:  AMBIEN TAKE 1/2 TO 1 TABLET BY MOUTH AT BEDTIME AS NEEDED FOR SLEEP.             Objective:   Physical Exam BP (!) 133/97 (BP Location: Left Arm, Patient Position: Sitting, Cuff Size: Normal)   Pulse 89   Temp (!) 97.3 F (36.3 C) (Temporal)   Resp 16   Ht 5\' 6"  (1.676 m)   Wt 199 lb 6 oz (90.4 kg)   LMP 11/06/2019 (Exact Date)   SpO2 99%   BMI 32.18 kg/m   General: Well developed, NAD, BMI noted Neck: No  thyromegaly  HEENT:  Normocephalic . Face symmetric, atraumatic Lungs:  CTA B Normal respiratory effort, no intercostal retractions, no accessory muscle use. Heart: RRR,  no murmur.  Abdomen:  Not distended, soft, non-tender. No rebound or rigidity.   Lower extremities: no pretibial edema bilaterally  Skin: Exposed areas without rash. Not pale. Not jaundice Neurologic:  alert & oriented X3.  Speech normal, gait appropriate for age and unassisted Strength symmetric and appropriate for age.  Psych: Cognition and judgment appear intact.  Cooperative with normal attention span and concentration.  Behavior  appropriate. No anxious or depressed appearing.     Assessment     Assessment Allergies and RAD Acne PCOS dx 2018 (Dr Chalmers Cater) Right hand tremor: Physiologic tremor per neurology   visit 08/2018 Anxiety  PLAN: Here for CPX Anxiety: Since the last visit he started Lexapro, feeling great, for now we will continue with the same dose, reassess in 1 year or sooner if needed.  Hardly ever uses Ambien since anxiety decreased. PCOS: Per Endo RTC 1 year  This visit occurred during the SARS-CoV-2 public health emergency.  Safety protocols were in place, including screening questions prior to the visit, additional usage of staff PPE, and extensive cleaning of exam room while observing appropriate contact time as indicated for disinfecting solutions.

## 2019-11-14 NOTE — Progress Notes (Signed)
Pre visit review using our clinic review tool, if applicable. No additional management support is needed unless otherwise documented below in the visit note. 

## 2019-11-14 NOTE — Patient Instructions (Signed)
  GO TO THE FRONT DESK Come back for a physical exam in 1 year, please make an appointment

## 2019-11-16 NOTE — Assessment & Plan Note (Signed)
Here for CPX Anxiety: Since the last visit he started Lexapro, feeling great, for now we will continue with the same dose, reassess in 1 year or sooner if needed.  Hardly ever uses Ambien since anxiety decreased. PCOS: Per Endo RTC 1 year

## 2019-11-16 NOTE — Assessment & Plan Note (Signed)
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  No need to do blood work today. -Form completed for her post graduate program, PPD placed today.

## 2019-11-17 LAB — TB SKIN TEST
Induration: NEGATIVE mm
TB Skin Test: NEGATIVE

## 2019-11-26 ENCOUNTER — Other Ambulatory Visit: Payer: Self-pay | Admitting: Internal Medicine

## 2020-03-25 ENCOUNTER — Encounter: Payer: Self-pay | Admitting: Internal Medicine

## 2020-05-11 ENCOUNTER — Telehealth: Payer: Self-pay

## 2020-05-11 MED ORDER — ZOLPIDEM TARTRATE 10 MG PO TABS: 5.0000 mg | ORAL_TABLET | Freq: Every evening | ORAL | 1 refills | Status: DC | PRN

## 2020-05-11 NOTE — Telephone Encounter (Signed)
PDMP okay, Rx sent 

## 2020-05-11 NOTE — Telephone Encounter (Signed)
Ambien refill.   Last OV: 11/14/2019 Last Fill: 11/05/2019 #30 and 0RF Pt sig: 1/2 to 1 tab qhs prn UDS: Ambien only

## 2020-07-20 ENCOUNTER — Other Ambulatory Visit: Payer: Self-pay

## 2020-07-20 ENCOUNTER — Encounter: Payer: Self-pay | Admitting: Internal Medicine

## 2020-07-20 ENCOUNTER — Ambulatory Visit: Payer: PRIVATE HEALTH INSURANCE | Admitting: Internal Medicine

## 2020-07-20 VITALS — BP 135/94 | HR 108 | Temp 98.7°F | Resp 16 | Ht 66.0 in | Wt 209.1 lb

## 2020-07-20 DIAGNOSIS — M542 Cervicalgia: Secondary | ICD-10-CM

## 2020-07-20 DIAGNOSIS — M546 Pain in thoracic spine: Secondary | ICD-10-CM | POA: Diagnosis not present

## 2020-07-20 DIAGNOSIS — K640 First degree hemorrhoids: Secondary | ICD-10-CM | POA: Diagnosis not present

## 2020-07-20 MED ORDER — HYDROCORTISONE ACETATE 25 MG RE SUPP: 25.0000 mg | Freq: Two times a day (BID) | RECTAL | 1 refills | Status: DC | PRN

## 2020-07-20 MED ORDER — CYCLOBENZAPRINE HCL 10 MG PO TABS: 10.0000 mg | ORAL_TABLET | Freq: Every evening | ORAL | 0 refills | Status: DC | PRN

## 2020-07-20 NOTE — Progress Notes (Signed)
Pre visit review using our clinic review tool, if applicable. No additional management support is needed unless otherwise documented below in the visit note. 

## 2020-07-20 NOTE — Patient Instructions (Signed)
Tylenol  500 mg OTC 2 tabs a day every 8 hours as needed for pain  IBUPROFEN (Advil or Motrin)   as needed for pain.  Always take it with food because may cause gastritis and ulcers.  If you notice nausea, stomach pain, change in the color of stools --->  Stop the medicine and let us know  Watch your posture, do stretching, check the [facility] website for more information  Hemorrhoids: High-fiber diet Metamucil if needed If problems use Anusol HC as needed, call if symptoms severe

## 2020-07-20 NOTE — Progress Notes (Signed)
Subjective:    Patient ID: Karen Lang, female    DOB: [birth year], 29 y.o.   MRN: [id number]  DOS:  07/20/2020 Type of visit - description: Acute Has 2 concerns.  She denies any chronic constipation but has been straining w/ BMs  lately. Noted some discomfort at the rectal area, last week felt a 1.5 cm bump that she believes is a hemorrhoid and she has seen red blood on the tissue paper (drops). Pain increases with bowel movements. This week the bumps seem to be smaller.  Denies abdominal pain, nausea or vomiting.  Stools themselves are normal.   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 Has developed mid back pain and some neck pain with no upper or lower extremity paresthesias. The pain has interfered with her sleep at times. She is using OTCs and heating pads.   Review of Systems See above   Past Medical History:  Diagnosis Date  . Acne   . Allergic rhinitis   . Closed fracture of nasal bone   . Deviated nasal septum    traumatic  . Glaucoma suspect   . Glaucoma suspect of both eyes   . Headache   . PCOS (polycystic ovarian syndrome) 07/27/2017    Past Surgical History:  Procedure Laterality Date  . NASAL SEPTUM SURGERY  03/27/2018   R inferior turbinate outfracture    Allergies as of 07/20/2020   No Known Allergies     Medication List       Accurate as of July 20, 2020  3:18 PM. If you have any questions, ask your nurse or doctor.        albuterol 108 (90 Base) MCG/ACT inhaler Commonly known as: Ventolin HFA Inhale 2 puffs into the lungs every 6 (six) hours as needed for wheezing or shortness of breath.   cetirizine 10 MG tablet Commonly known as: ZYRTEC Take 1 tablet (10 mg total) by mouth daily.   escitalopram 10 MG tablet Commonly known as: LEXAPRO Take 1 tablet (10 mg total) by mouth daily.   metFORMIN 500 MG tablet Commonly known as: GLUCOPHAGE Take by mouth daily.   Ocella  3-0.03 MG tablet Generic drug: drospirenone-ethinyl estradiol Take 1 tablet by mouth daily.   zolpidem 10 MG tablet Commonly known as: AMBIEN Take 0.5-1 tablets (5-10 mg total) by mouth at bedtime as needed. for sleep          Objective:   Physical Exam BP (!) 135/94 (BP Location: Left Arm, Patient Position: Sitting, Cuff Size: Normal)   Pulse (!) 108   Temp 98.7 F (37.1 C) (Oral)   Resp 16   Ht 5\' 6"  (1.676 m)   Wt 209 lb 2 oz (94.9 kg)   LMP 07/20/2020   SpO2 100%   BMI 33.75 kg/m  General:   Well developed, NAD, BMI noted. HEENT:  Normocephalic . Face symmetric, atraumatic MSK: No TTP at the cervical or lumbar spine. Lower extremities: no pretibial edema bilaterally  Skin: Not pale. Not jaundice Neurologic:  alert & oriented X3.  Speech normal, gait appropriate for age and unassisted.  Motor and DTR symmetric DRE: External examination normal.  DRE with no mass, blood, mucus or stools Anoscopy: + Internal hemorrhoids Psych--  Cognition and judgment appear intact.  Cooperative with normal attention span and concentration.  Behavior appropriate. No anxious or depressed appearing.      Assessment  Assessment Allergies and RAD Acne PCOS dx 2018 (Dr Talmage Nap) Right hand tremor: Physiologic tremor per neurology   visit 08/2018 Anxiety  PLAN: Hemorrhoids: She probably had a external hemorrhoid that is now resolved, she does have internal hemorrhoids. Recommend high-fiber diet, Metamucil, stool softener as needed to prevent straining. For acute treatment, prescribed Anusol HC as needed. Mid back pain, neck pain: Without red flag symptoms, possibly postural, with talk about the stretching, use of OTCs; she has some difficulty sleeping at night d/t pain, not taking Ambien. I offer muscle relaxant at bedtime to help and we agreed to do that. She will see her chiropractor soon. Let me know if not better.  Formal PT?    This visit occurred during the  SARS-CoV-2 public health emergency.  Safety protocols were in place, including screening questions prior to the visit, additional usage of staff PPE, and extensive cleaning of exam room while observing appropriate contact time as indicated for disinfecting solutions.

## 2020-07-21 NOTE — Assessment & Plan Note (Signed)
Hemorrhoids: She probably had a external hemorrhoid that is now resolved, she does have internal hemorrhoids. Recommend high-fiber diet, Metamucil, stool softener as needed to prevent straining. For acute treatment, prescribed Anusol HC as needed. Mid back pain, neck pain: Without red flag symptoms, possibly postural, with talk about the stretching, use of OTCs; she has some difficulty sleeping at night d/t pain, not taking Ambien. I offer muscle relaxant at bedtime to help and we agreed to do that. She will see her chiropractor soon. Let me know if not better.  Formal PT?

## 2020-08-18 ENCOUNTER — Ambulatory Visit (INDEPENDENT_AMBULATORY_CARE_PROVIDER_SITE_OTHER): Payer: PRIVATE HEALTH INSURANCE

## 2020-08-18 ENCOUNTER — Other Ambulatory Visit: Payer: Self-pay

## 2020-08-18 DIAGNOSIS — Z111 Encounter for screening for respiratory tuberculosis: Secondary | ICD-10-CM | POA: Diagnosis not present

## 2020-08-18 NOTE — Progress Notes (Signed)
Pt received SQ tb test today . Left lower fore arm. Tolerated well.will return Friday for reading

## 2020-08-20 ENCOUNTER — Other Ambulatory Visit: Payer: Self-pay

## 2020-08-20 DIAGNOSIS — Z111 Encounter for screening for respiratory tuberculosis: Secondary | ICD-10-CM

## 2020-08-20 LAB — TB SKIN TEST
Induration: 0 mm
TB Skin Test: NEGATIVE

## 2020-08-20 NOTE — Progress Notes (Signed)
Pt is here today for ppd reading. Pt's reading was negative.

## 2020-09-14 LAB — HEMOGLOBIN A1C: Hemoglobin A1C: 4.7

## 2020-09-14 LAB — BASIC METABOLIC PANEL
BUN: 10 (ref 4–21)
CO2: 27 — AB (ref 13–22)
Chloride: 106 (ref 99–108)
Creatinine: 0.7 (ref 0.5–1.1)
Glucose: 88
Potassium: 4.5 (ref 3.4–5.3)
Sodium: 143 (ref 137–147)

## 2020-09-14 LAB — TSH: TSH: 2.4 (ref 0.41–5.90)

## 2020-09-14 LAB — COMPREHENSIVE METABOLIC PANEL
Calcium: 8.7 (ref 8.7–10.7)
GFR calc Af Amer: 128.52
GFR calc non Af Amer: 106.21

## 2020-11-15 ENCOUNTER — Encounter: Payer: Self-pay | Admitting: Internal Medicine

## 2020-11-15 ENCOUNTER — Other Ambulatory Visit: Payer: Self-pay

## 2020-11-15 ENCOUNTER — Ambulatory Visit (INDEPENDENT_AMBULATORY_CARE_PROVIDER_SITE_OTHER): Payer: 59 | Admitting: Internal Medicine

## 2020-11-15 VITALS — BP 128/90 | HR 101 | Temp 98.3°F | Resp 16 | Ht 66.0 in | Wt 208.0 lb

## 2020-11-15 DIAGNOSIS — Z0000 Encounter for general adult medical examination without abnormal findings: Secondary | ICD-10-CM | POA: Diagnosis not present

## 2020-11-15 DIAGNOSIS — E8881 Metabolic syndrome: Secondary | ICD-10-CM | POA: Insufficient documentation

## 2020-11-15 DIAGNOSIS — Z1159 Encounter for screening for other viral diseases: Secondary | ICD-10-CM

## 2020-11-15 DIAGNOSIS — E038 Other specified hypothyroidism: Secondary | ICD-10-CM | POA: Insufficient documentation

## 2020-11-15 DIAGNOSIS — L680 Hirsutism: Secondary | ICD-10-CM | POA: Insufficient documentation

## 2020-11-15 LAB — CBC WITH DIFFERENTIAL/PLATELET
Absolute Monocytes: 350 cells/uL (ref 200–950)
Basophils Absolute: 21 cells/uL (ref 0–200)
Basophils Relative: 0.4 %
Eosinophils Absolute: 122 cells/uL (ref 15–500)
Eosinophils Relative: 2.3 %
HCT: 41.5 % (ref 35.0–45.0)
Hemoglobin: 14.2 g/dL (ref 11.7–15.5)
Lymphs Abs: 2078 cells/uL (ref 850–3900)
MCH: 26.9 pg — ABNORMAL LOW (ref 27.0–33.0)
MCHC: 34.2 g/dL (ref 32.0–36.0)
MCV: 78.7 fL — ABNORMAL LOW (ref 80.0–100.0)
MPV: 10.2 fL (ref 7.5–12.5)
Monocytes Relative: 6.6 %
Neutro Abs: 2730 cells/uL (ref 1500–7800)
Neutrophils Relative %: 51.5 %
Platelets: 297 10*3/uL (ref 140–400)
RBC: 5.27 10*6/uL — ABNORMAL HIGH (ref 3.80–5.10)
RDW: 13.3 % (ref 11.0–15.0)
Total Lymphocyte: 39.2 %
WBC: 5.3 10*3/uL (ref 3.8–10.8)

## 2020-11-15 LAB — LIPID PANEL
Cholesterol: 187 mg/dL (ref <200)
HDL: 63 mg/dL (ref >=50)
LDL Cholesterol (Calc): 103 mg/dL (calc) — ABNORMAL HIGH
Non-HDL Cholesterol (Calc): 124 mg/dL (calc) (ref <130)
Total CHOL/HDL Ratio: 3 (calc) (ref <5.0)
Triglycerides: 113 mg/dL (ref <150)

## 2020-11-15 NOTE — Progress Notes (Signed)
Subjective:    Patient ID: Karen Lang, female    DOB: 06/08/1991, 30 y.o.   MRN: [id number]  DOS:  11/15/2020 Type of visit - description: cpx  Since the last office visit she is doing well. She has some issues with hemorrhoids: Resolved She has some issues with muscle aches, better   Review of Systems  Other than above, a 14 point review of systems is negative      Past Medical History:  Diagnosis Date   Acne    Allergic rhinitis    Closed fracture of nasal bone    Deviated nasal septum    traumatic   Glaucoma suspect    Glaucoma suspect of both eyes    Headache    PCOS (polycystic ovarian syndrome) 07/27/2017    Past Surgical History:  Procedure Laterality Date   NASAL SEPTUM SURGERY  03/27/2018   R inferior turbinate outfracture    Allergies as of 11/15/2020   No Known Allergies     Medication List       Accurate as of November 15, 2020 11:59 PM. If you have any questions, ask your nurse or doctor.        STOP taking these medications   cetirizine 10 MG tablet Commonly known as: ZYRTEC Stopped by: Willow Ora, MD     TAKE these medications   albuterol 108 (90 Base) MCG/ACT inhaler Commonly known as: Ventolin HFA Inhale 2 puffs into the lungs every 6 (six) hours as needed for wheezing or shortness of breath.   cyclobenzaprine 10 MG tablet Commonly known as: FLEXERIL Take 1 tablet (10 mg total) by mouth at bedtime as needed for muscle spasms.   drospirenone-ethinyl estradiol 3-0.03 MG tablet Commonly known as: YASMIN Take 1 tablet by mouth daily.   escitalopram 10 MG tablet Commonly known as: LEXAPRO Take 1 tablet (10 mg total) by mouth daily.   hydrocortisone 25 MG suppository Commonly known as: ANUSOL-HC Place 1 suppository (25 mg total) rectally 2 (two) times daily as needed for hemorrhoids.   metFORMIN 500 MG tablet Commonly known as: GLUCOPHAGE Take by mouth daily.   zolpidem 10 MG tablet Commonly known as: AMBIEN Take  0.5-1 tablets (5-10 mg total) by mouth at bedtime as needed. for sleep          Objective:   Physical Exam BP 128/90 (BP Location: Left Arm, Patient Position: Sitting, Cuff Size: Small)    Pulse (!) 101    Temp 98.3 F (36.8 C) (Oral)    Resp 16    Ht 5\' 6"  (1.676 m)    Wt 208 lb (94.3 kg)    LMP 11/08/2020 (Approximate)    SpO2 97%    BMI 33.57 kg/m  General: Well developed, NAD, BMI noted Neck: No  thyromegaly  HEENT:  Normocephalic . Face symmetric, atraumatic Lungs:  CTA B Normal respiratory effort, no intercostal retractions, no accessory muscle use. Heart: RRR,  no murmur.  Abdomen:  Not distended, soft, non-tender. No rebound or rigidity.   Lower extremities: no pretibial edema bilaterally  Skin: Exposed areas without rash. Not pale. Not jaundice Neurologic:  alert & oriented X3.  Speech normal, gait appropriate for age and unassisted Strength symmetric and appropriate for age.  Psych: Cognition and judgment appear intact.  Cooperative with normal attention span and concentration.  Behavior appropriate. No anxious or depressed appearing.     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  Safety protocols were in place, including screening questions prior to the visit, additional usage of staff PPE, and extensive cleaning of exam room while observing appropriate contact time as indicated for disinfecting solutions.

## 2020-11-15 NOTE — Progress Notes (Signed)
Pre visit review using our clinic review tool, if applicable. No additional management support is needed unless otherwise documented below in the visit note. 

## 2020-11-15 NOTE — Patient Instructions (Addendum)
Good luck with your masters!  GO TO THE LAB : Get the blood work     GO TO THE FRONT DESK, PLEASE SCHEDULE YOUR APPOINTMENTS Come back for a physical exam in 1 year

## 2020-11-16 ENCOUNTER — Encounter: Payer: Self-pay | Admitting: Internal Medicine

## 2020-11-16 LAB — HEPATITIS C ANTIBODY
Hepatitis C Ab: NONREACTIVE
SIGNAL TO CUT-OFF: 0.01 (ref <1.00)

## 2020-11-16 NOTE — Assessment & Plan Note (Signed)
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 Check FLP, hep C, CBC -Form completed for her post graduate program, PPD neg 08-18-2020

## 2020-11-16 NOTE — Assessment & Plan Note (Signed)
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

## 2020-12-10 ENCOUNTER — Telehealth: Payer: Self-pay | Admitting: Internal Medicine

## 2020-12-10 MED ORDER — ESCITALOPRAM OXALATE 10 MG PO TABS: 10.0000 mg | ORAL_TABLET | Freq: Every day | ORAL | 3 refills | Status: DC

## 2020-12-10 NOTE — Telephone Encounter (Signed)
Rx sent 

## 2020-12-10 NOTE — Telephone Encounter (Signed)
Patient switch pharmacy  Medication: escitalopram (LEXAPRO) 10 MG tablet [[id number]]       Has the patient contacted their pharmacy?  (If no, request that the patient contact the pharmacy for the refill.) (If yes, when and what did the pharmacy advise?)     Preferred Pharmacy (with phone number or street name):  [facility] [address] - [place], [place] - [address] Phone:  [phone]  Fax:  [phone]          Agent: Please be advised that RX refills may take up to 3 business days. We ask that you follow-up with your pharmacy.

## 2021-01-13 ENCOUNTER — Encounter: Payer: Self-pay | Admitting: Internal Medicine

## 2021-02-07 ENCOUNTER — Ambulatory Visit (INDEPENDENT_AMBULATORY_CARE_PROVIDER_SITE_OTHER): Payer: 59 | Admitting: Family Medicine

## 2021-02-07 ENCOUNTER — Other Ambulatory Visit: Payer: Self-pay

## 2021-02-07 ENCOUNTER — Encounter: Payer: Self-pay | Admitting: Family Medicine

## 2021-02-07 ENCOUNTER — Telehealth: Payer: Self-pay | Admitting: *Deleted

## 2021-02-07 VITALS — BP 132/88 | HR 86 | Temp 98.8°F | Ht 65.0 in | Wt 210.8 lb

## 2021-02-07 DIAGNOSIS — F410 Panic disorder [episodic paroxysmal anxiety] without agoraphobia: Secondary | ICD-10-CM | POA: Diagnosis not present

## 2021-02-07 MED ORDER — ALPRAZOLAM 0.25 MG PO TABS: 0.2500 mg | ORAL_TABLET | Freq: Three times a day (TID) | ORAL | 0 refills | Status: DC | PRN

## 2021-02-07 NOTE — Patient Instructions (Signed)
Good to see you today- I am sorry you are having such a hard time right now!    Continue lexapro Use alprazolam as needed for panic or insomnia. Use conservatively as it can be habit forming- do not combine with Ambien  Let me know if you are not doing ok and getting back to your normal state in the next couple of weeks!   If any risk of self harm seek immediate emergency care

## 2021-02-07 NOTE — Telephone Encounter (Signed)
Called pt and left VM. -JMA

## 2021-02-07 NOTE — Telephone Encounter (Signed)
Caller Name Khylie Larmore Caller Phone Number [phone] Patient Name Karen Lang Patient DOB Jan 08, 1991 Call Type Message Only Information Provided Reason for Call Request to Schedule Office Appointment Initial Comment Caller wants appointment with Dr. Drue Novel today if possible. Patient request to speak to RN No Additional Comment Office hours provided. Triage refused. Disp. Time Disposition Final User 02/07/2021 7:06:28 AM General Information Provided Yes Tessa Lerner
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
## 2021-02-08 NOTE — Telephone Encounter (Signed)
Pt seen by Dr. Copland yesterday.  

## 2021-03-26 ENCOUNTER — Encounter: Payer: Self-pay | Admitting: Internal Medicine

## 2021-03-28 MED ORDER — ZOLPIDEM TARTRATE 10 MG PO TABS: 5.0000 mg | ORAL_TABLET | Freq: Every evening | ORAL | 2 refills | Status: DC | PRN

## 2021-03-28 NOTE — Telephone Encounter (Signed)
PDMP okay, Rx sent 

## 2021-03-28 NOTE — Telephone Encounter (Signed)
Requesting: Ambien 10mg  Contract: 11/15/2020 UDS: N/a Last Visit: 11/15/2020 Next Visit: 11/16/2021 Last Refill: 05/11/2020 #30 and 1RF  Please Advise

## 2021-08-04 ENCOUNTER — Telehealth: Payer: Self-pay

## 2021-08-04 MED ORDER — ZOLPIDEM TARTRATE 10 MG PO TABS
5.0000 mg | ORAL_TABLET | Freq: Every evening | ORAL | 0 refills | Status: AC | PRN
Start: 2021-08-04 — End: ?

## 2021-08-04 MED ORDER — ESCITALOPRAM OXALATE 10 MG PO TABS: 10.0000 mg | ORAL_TABLET | Freq: Every day | ORAL | 1 refills | Status: DC

## 2021-08-04 NOTE — Telephone Encounter (Signed)
PDMP okay, noted to be on phentermine.  Prescription sent

## 2021-08-04 NOTE — Telephone Encounter (Signed)
Requesting: Ambien 10mg   Contract: 11/15/2020 UDS: None Last Visit: 11/15/2020  Next Visit: 11/16/2021  Last Refill: 03/28/2021 #30 and 2RF  Please Advise

## 2021-11-16 ENCOUNTER — Encounter: Payer: 59 | Admitting: Internal Medicine

## 2023-03-20 ENCOUNTER — Other Ambulatory Visit (HOSPITAL_BASED_OUTPATIENT_CLINIC_OR_DEPARTMENT_OTHER): Payer: Self-pay

## 2023-03-20 MED ORDER — VITAMIN D (ERGOCALCIFEROL) 1.25 MG (50000 UNIT) PO CAPS
50000.0000 [IU] | ORAL_CAPSULE | ORAL | 3 refills | Status: AC
  Filled 2023-03-20: qty 4, 28d supply, fill #0
  Filled 2023-05-02 – 2023-05-04 (×2): qty 4, 28d supply, fill #1
  Filled 2023-06-13: qty 4, 28d supply, fill #2
  Filled 2023-08-10: qty 4, 28d supply, fill #3
  Filled 2023-09-17: qty 4, 28d supply, fill #4
  Filled 2023-11-05: qty 4, 28d supply, fill #5
  Filled 2023-12-17: qty 4, 28d supply, fill #6
  Filled 2024-02-18: qty 4, 28d supply, fill #7

## 2023-05-01 ENCOUNTER — Other Ambulatory Visit (HOSPITAL_BASED_OUTPATIENT_CLINIC_OR_DEPARTMENT_OTHER): Payer: Self-pay

## 2023-05-01 DIAGNOSIS — E669 Obesity, unspecified: Secondary | ICD-10-CM | POA: Diagnosis not present

## 2023-05-01 MED ORDER — NYSTATIN-TRIAMCINOLONE 100000-0.1 UNIT/GM-% EX CREA
TOPICAL_CREAM | CUTANEOUS | 3 refills | Status: DC
  Filled 2023-05-01: qty 30, 30d supply, fill #0

## 2023-05-02 ENCOUNTER — Other Ambulatory Visit: Payer: Self-pay

## 2023-05-04 ENCOUNTER — Other Ambulatory Visit: Payer: Self-pay

## 2023-05-11 ENCOUNTER — Ambulatory Visit: Payer: Commercial Managed Care - PPO | Admitting: Podiatry

## 2023-05-11 ENCOUNTER — Other Ambulatory Visit (HOSPITAL_BASED_OUTPATIENT_CLINIC_OR_DEPARTMENT_OTHER): Payer: Self-pay

## 2023-05-11 ENCOUNTER — Encounter: Payer: Self-pay | Admitting: Podiatry

## 2023-05-11 DIAGNOSIS — M722 Plantar fascial fibromatosis: Secondary | ICD-10-CM

## 2023-05-11 MED ORDER — MELOXICAM 15 MG PO TABS
15.0000 mg | ORAL_TABLET | Freq: Every day | ORAL | 0 refills | Status: DC
  Filled 2023-05-11: qty 30, 30d supply, fill #0

## 2023-05-11 NOTE — Patient Instructions (Signed)

## 2023-05-11 NOTE — Progress Notes (Signed)
  Subjective:  Patient ID: Karen Lang, female    DOB: 14-Sep-1991,  MRN: [id number]  Chief Complaint  Patient presents with   Neuroma    RM2: PF of left foot orthotics and medication not helping    Plantar Fasciitis    32 y.o. female presents with the above complaint.  Patient states a history of heel pain in the left heel.  She has tried orthotics as well as NSAIDs including ibuprofen 6 to 800 mg.  She is also doing some icing and stretching.   Review of Systems: Negative except as noted in the HPI. Denies N/V/F/Ch.   Objective:  There were no vitals filed for this visit. There is no height or weight on file to calculate BMI. Constitutional Well developed. Well nourished.  Vascular Dorsalis pedis pulses palpable bilaterally. Posterior tibial pulses palpable bilaterally. Capillary refill normal to all digits.  No cyanosis or clubbing noted. Pedal hair growth normal.  Neurologic Normal speech. Oriented to person, place, and time. Epicritic sensation to light touch grossly present bilaterally.  Dermatologic Nails well groomed and normal in appearance. No open wounds. No skin lesions.  Orthopedic: Normal joint ROM without pain or crepitus bilaterally. No visible deformities. Tender to palpation at the calcaneal tuber left. No pain with calcaneal squeeze left. Ankle ROM diminished range of motion left. Silfverskiold Test: negative left.   Radiographs: Deferred  Assessment:   1. Plantar fasciitis of left foot    Plan:  Patient was evaluated and treated and all questions answered.  Plantar Fasciitis, left -Deferred x-rays patient has clinical impression plantar fasciitis - Educated on icing and stretching. Instructions given.  - Injection delivered to the plantar fascia as below. - DME: Continue use of over-the-counter orthotics patient has purchased - Pharmacologic management: Meloxicam 15 mg take once daily for next 30 days. Educated on risks/benefits and proper  taking of medication.  Procedure: Injection Tendon/Ligament Location: Left plantar fascia at the glabrous junction; medial approach. Skin Prep: alcohol Injectate: 1 cc 0.5% marcaine plain, 1 cc kenalog 10. Disposition: Patient tolerated procedure well. Injection site dressed with a band-aid.  Return in about 4 weeks (around 06/08/2023) for f/u L plantar fasc.

## 2023-05-29 DIAGNOSIS — M9902 Segmental and somatic dysfunction of thoracic region: Secondary | ICD-10-CM | POA: Diagnosis not present

## 2023-05-29 DIAGNOSIS — M9906 Segmental and somatic dysfunction of lower extremity: Secondary | ICD-10-CM | POA: Diagnosis not present

## 2023-05-29 DIAGNOSIS — M9905 Segmental and somatic dysfunction of pelvic region: Secondary | ICD-10-CM | POA: Diagnosis not present

## 2023-05-29 DIAGNOSIS — M9903 Segmental and somatic dysfunction of lumbar region: Secondary | ICD-10-CM | POA: Diagnosis not present

## 2023-05-29 DIAGNOSIS — M9901 Segmental and somatic dysfunction of cervical region: Secondary | ICD-10-CM | POA: Diagnosis not present

## 2023-06-06 ENCOUNTER — Other Ambulatory Visit (HOSPITAL_BASED_OUTPATIENT_CLINIC_OR_DEPARTMENT_OTHER): Payer: Self-pay

## 2023-06-06 MED ORDER — AMOXICILLIN-POT CLAVULANATE 875-125 MG PO TABS
1.0000 | ORAL_TABLET | Freq: Two times a day (BID) | ORAL | 0 refills | Status: AC
  Filled 2023-06-06: qty 14, 7d supply, fill #0

## 2023-06-06 MED ORDER — FLUCONAZOLE 150 MG PO TABS
150.0000 mg | ORAL_TABLET | ORAL | 0 refills | Status: DC
  Filled 2023-06-06: qty 2, 6d supply, fill #0

## 2023-06-15 ENCOUNTER — Encounter: Payer: Self-pay | Admitting: Podiatry

## 2023-06-15 ENCOUNTER — Other Ambulatory Visit (HOSPITAL_BASED_OUTPATIENT_CLINIC_OR_DEPARTMENT_OTHER): Payer: Self-pay

## 2023-06-15 ENCOUNTER — Ambulatory Visit: Payer: Commercial Managed Care - PPO | Admitting: Podiatry

## 2023-06-15 VITALS — Ht 65.0 in | Wt 210.0 lb

## 2023-06-15 DIAGNOSIS — M722 Plantar fascial fibromatosis: Secondary | ICD-10-CM | POA: Diagnosis not present

## 2023-06-15 MED ORDER — FLULAVAL 0.5 ML IM SUSY
0.5000 mL | PREFILLED_SYRINGE | Freq: Once | INTRAMUSCULAR | 0 refills | Status: AC
  Filled 2023-06-15: qty 0.5, 1d supply, fill #0

## 2023-06-15 MED ORDER — MELOXICAM 15 MG PO TABS
15.0000 mg | ORAL_TABLET | Freq: Every day | ORAL | 0 refills | Status: DC
  Filled 2023-06-15: qty 30, 30d supply, fill #0

## 2023-06-15 NOTE — Patient Instructions (Signed)

## 2023-06-15 NOTE — Progress Notes (Signed)
  Subjective:  Patient ID: Karen Lang, female    DOB: 02/16/1991,  MRN: [id number]  Chief Complaint  Patient presents with   Plantar Fasciitis    Patient is here for 24M F/U states feet are feeling better    32 y.o. female presents for follow-up of left plantar fasciitis.  Left pain is about 75% improvement overall feeling better than before.  She did take meloxicam for 30 days but is no longer taking pain.  Has an upcoming trip to [place] and wants to see if another shot can be done to reduce her pain even further prior to that trip she is wearing power step orthotics in her shoes.  Has been doing stretching icing as well  Review of Systems: Negative except as noted in the HPI. Denies N/V/F/Ch.   Objective:  There were no vitals filed for this visit. Body mass index is 34.95 kg/m. Constitutional Well developed. Well nourished.  Vascular Dorsalis pedis pulses palpable bilaterally. Posterior tibial pulses palpable bilaterally. Capillary refill normal to all digits.  No cyanosis or clubbing noted. Pedal hair growth normal.  Neurologic Normal speech. Oriented to person, place, and time. Epicritic sensation to light touch grossly present bilaterally.  Dermatologic Nails well groomed and normal in appearance. No open wounds. No skin lesions.  Orthopedic: Normal joint ROM without pain or crepitus bilaterally. No visible deformities. Decreased tenderness to palpation at the calcaneal tuber left. No pain with calcaneal squeeze left. Ankle ROM diminished range of motion left. Silfverskiold Test: negative left.   Radiographs: Deferred  Assessment:   1. Plantar fasciitis of left foot     Plan:  Patient was evaluated and treated and all questions answered.  Plantar Fasciitis, left -75% improvement after prior steroid injection still with some lingering pain after long days walking -Deferred x-rays patient has clinical impression plantar fasciitis - Educated on icing and  stretching. Instructions given.  - Injection delivered to the plantar fascia as below. - DME: Continue use of over-the-counter orthotics  - Pharmacologic management: Meloxicam 15 mg take once daily for next 30 days refill sent  Procedure: Injection Tendon/Ligament Location: Left plantar fascia at the glabrous junction; medial approach. Skin Prep: alcohol Injectate: 1 cc 0.5% marcaine plain, 1 cc kenalog 10. Disposition: Patient tolerated procedure well. Injection site dressed with a band-aid.  Return if symptoms worsen or fail to improve.

## 2023-07-11 DIAGNOSIS — Z129 Encounter for screening for malignant neoplasm, site unspecified: Secondary | ICD-10-CM | POA: Diagnosis not present

## 2023-07-11 DIAGNOSIS — D224 Melanocytic nevi of scalp and neck: Secondary | ICD-10-CM | POA: Diagnosis not present

## 2023-07-11 DIAGNOSIS — L304 Erythema intertrigo: Secondary | ICD-10-CM | POA: Diagnosis not present

## 2023-07-11 DIAGNOSIS — L718 Other rosacea: Secondary | ICD-10-CM | POA: Diagnosis not present

## 2023-07-18 DIAGNOSIS — M9901 Segmental and somatic dysfunction of cervical region: Secondary | ICD-10-CM | POA: Diagnosis not present

## 2023-07-18 DIAGNOSIS — M9902 Segmental and somatic dysfunction of thoracic region: Secondary | ICD-10-CM | POA: Diagnosis not present

## 2023-07-18 DIAGNOSIS — M9905 Segmental and somatic dysfunction of pelvic region: Secondary | ICD-10-CM | POA: Diagnosis not present

## 2023-07-18 DIAGNOSIS — M9906 Segmental and somatic dysfunction of lower extremity: Secondary | ICD-10-CM | POA: Diagnosis not present

## 2023-07-18 DIAGNOSIS — M9903 Segmental and somatic dysfunction of lumbar region: Secondary | ICD-10-CM | POA: Diagnosis not present

## 2023-08-23 DIAGNOSIS — M9901 Segmental and somatic dysfunction of cervical region: Secondary | ICD-10-CM | POA: Diagnosis not present

## 2023-08-23 DIAGNOSIS — M9906 Segmental and somatic dysfunction of lower extremity: Secondary | ICD-10-CM | POA: Diagnosis not present

## 2023-08-23 DIAGNOSIS — M9905 Segmental and somatic dysfunction of pelvic region: Secondary | ICD-10-CM | POA: Diagnosis not present

## 2023-08-23 DIAGNOSIS — M9902 Segmental and somatic dysfunction of thoracic region: Secondary | ICD-10-CM | POA: Diagnosis not present

## 2023-08-23 DIAGNOSIS — M9903 Segmental and somatic dysfunction of lumbar region: Secondary | ICD-10-CM | POA: Diagnosis not present

## 2023-09-25 DIAGNOSIS — Z01419 Encounter for gynecological examination (general) (routine) without abnormal findings: Secondary | ICD-10-CM | POA: Diagnosis not present

## 2023-09-25 DIAGNOSIS — Z118 Encounter for screening for other infectious and parasitic diseases: Secondary | ICD-10-CM | POA: Diagnosis not present

## 2023-09-25 DIAGNOSIS — Z01411 Encounter for gynecological examination (general) (routine) with abnormal findings: Secondary | ICD-10-CM | POA: Diagnosis not present

## 2023-09-25 DIAGNOSIS — Z124 Encounter for screening for malignant neoplasm of cervix: Secondary | ICD-10-CM | POA: Diagnosis not present

## 2023-09-25 DIAGNOSIS — Z1159 Encounter for screening for other viral diseases: Secondary | ICD-10-CM | POA: Diagnosis not present

## 2023-09-25 DIAGNOSIS — Z113 Encounter for screening for infections with a predominantly sexual mode of transmission: Secondary | ICD-10-CM | POA: Diagnosis not present

## 2023-09-25 DIAGNOSIS — Z114 Encounter for screening for human immunodeficiency virus [HIV]: Secondary | ICD-10-CM | POA: Diagnosis not present

## 2023-09-25 DIAGNOSIS — Z1331 Encounter for screening for depression: Secondary | ICD-10-CM | POA: Diagnosis not present

## 2023-09-26 ENCOUNTER — Other Ambulatory Visit (HOSPITAL_BASED_OUTPATIENT_CLINIC_OR_DEPARTMENT_OTHER): Payer: Self-pay

## 2023-09-26 ENCOUNTER — Telehealth: Payer: Self-pay | Admitting: Family Medicine

## 2023-09-26 DIAGNOSIS — J020 Streptococcal pharyngitis: Secondary | ICD-10-CM

## 2023-09-26 MED ORDER — LIDOCAINE VISCOUS HCL 2 % MT SOLN
15.0000 mL | OROMUCOSAL | 0 refills | Status: DC | PRN
Start: 2023-09-26 — End: 2023-12-12
  Filled 2023-09-26: qty 100, 6d supply, fill #0

## 2023-09-26 MED ORDER — AMOXICILLIN 500 MG PO CAPS
500.0000 mg | ORAL_CAPSULE | Freq: Two times a day (BID) | ORAL | 0 refills | Status: AC
Start: 2023-09-26 — End: 2023-10-06
  Filled 2023-09-26: qty 20, 10d supply, fill #0

## 2023-09-26 NOTE — Progress Notes (Signed)
E-Visit for Sore Throat - Strep Symptoms  We are sorry that you are not feeling well.  Here is how we plan to help!  Based on what you have shared with me it is likely that you have strep pharyngitis.  Strep pharyngitis is inflammation and infection in the back of the throat.  This is an infection cause by bacteria and is treated with antibiotics.  I have prescribed Amoxicillin 500 mg twice a day for 10 days and 2% Viscous Lidocaine 5 ml gargle and swallow every 4 hours as needed for throat pain. For throat pain, we recommend over the counter oral pain relief medications such as acetaminophen or aspirin, or anti-inflammatory medications such as ibuprofen or naproxen sodium. Topical treatments such as oral throat lozenges or sprays may be used as needed. Strep infections are not as easily transmitted as other respiratory infections, however we still recommend that you avoid close contact with loved ones, especially the very young and elderly.  Remember to wash your hands thoroughly throughout the day as this is the number one way to prevent the spread of infection and wipe down door knobs and counters with disinfectant.   Home Care: Only take medications as instructed by your medical team. Complete the entire course of an antibiotic. Do not take these medications with alcohol. A steam or ultrasonic humidifier can help congestion.  You can place a towel over your head and breathe in the steam from hot water coming from a faucet. Avoid close contacts especially the very young and the elderly. Cover your mouth when you cough or sneeze. Always remember to wash your hands.  Get Help Right Away If: You develop worsening fever or sinus pain. You develop a severe head ache or visual changes. Your symptoms persist after you have completed your treatment plan.  Make sure you Understand these instructions. Will watch your condition. Will get help right away if you are not doing well or get  worse.   Thank you for choosing an e-visit.  Your e-visit answers were reviewed by a board certified advanced clinical practitioner to complete your personal care plan. Depending upon the condition, your plan could have included both over the counter or prescription medications.  Please review your pharmacy choice. Make sure the pharmacy is open so you can pick up prescription now. If there is a problem, you may contact your provider through [employer] and have the prescription routed to another pharmacy.  Your safety is important to [place]. If you have drug allergies check your prescription carefully.   For the next 24 hours you can use MyChart to ask questions about today's visit, request a non-urgent call back, or ask for a work or school excuse. You will get an email in the next two days asking about your experience. I hope that your e-visit has been valuable and will speed your recovery.  I provided 5 minutes of non face-to-face time during this encounter for chart review, medication and order placement, as well as and documentation.

## 2023-10-01 LAB — HM PAP SMEAR: HPV, high-risk: NEGATIVE

## 2023-10-04 DIAGNOSIS — M9903 Segmental and somatic dysfunction of lumbar region: Secondary | ICD-10-CM | POA: Diagnosis not present

## 2023-10-04 DIAGNOSIS — M9902 Segmental and somatic dysfunction of thoracic region: Secondary | ICD-10-CM | POA: Diagnosis not present

## 2023-10-04 DIAGNOSIS — M9905 Segmental and somatic dysfunction of pelvic region: Secondary | ICD-10-CM | POA: Diagnosis not present

## 2023-10-04 DIAGNOSIS — M9906 Segmental and somatic dysfunction of lower extremity: Secondary | ICD-10-CM | POA: Diagnosis not present

## 2023-10-04 DIAGNOSIS — M9901 Segmental and somatic dysfunction of cervical region: Secondary | ICD-10-CM | POA: Diagnosis not present

## 2023-10-18 DIAGNOSIS — F4322 Adjustment disorder with anxiety: Secondary | ICD-10-CM | POA: Diagnosis not present

## 2023-11-02 ENCOUNTER — Other Ambulatory Visit (HOSPITAL_BASED_OUTPATIENT_CLINIC_OR_DEPARTMENT_OTHER): Payer: Self-pay

## 2023-11-02 DIAGNOSIS — S93492A Sprain of other ligament of left ankle, initial encounter: Secondary | ICD-10-CM | POA: Diagnosis not present

## 2023-11-02 DIAGNOSIS — M25572 Pain in left ankle and joints of left foot: Secondary | ICD-10-CM | POA: Diagnosis not present

## 2023-11-02 MED ORDER — MELOXICAM 7.5 MG PO TABS
7.5000 mg | ORAL_TABLET | Freq: Two times a day (BID) | ORAL | 0 refills | Status: DC
  Filled 2023-11-02: qty 60, 30d supply, fill #0

## 2023-11-06 DIAGNOSIS — F4322 Adjustment disorder with anxiety: Secondary | ICD-10-CM | POA: Diagnosis not present

## 2023-11-16 ENCOUNTER — Ambulatory Visit (HOSPITAL_BASED_OUTPATIENT_CLINIC_OR_DEPARTMENT_OTHER): Payer: Self-pay | Admitting: Orthopaedic Surgery

## 2023-11-20 DIAGNOSIS — F4322 Adjustment disorder with anxiety: Secondary | ICD-10-CM | POA: Diagnosis not present

## 2023-11-23 DIAGNOSIS — Z8619 Personal history of other infectious and parasitic diseases: Secondary | ICD-10-CM | POA: Diagnosis not present

## 2023-11-23 DIAGNOSIS — Z118 Encounter for screening for other infectious and parasitic diseases: Secondary | ICD-10-CM | POA: Diagnosis not present

## 2023-11-26 DIAGNOSIS — H5213 Myopia, bilateral: Secondary | ICD-10-CM | POA: Diagnosis not present

## 2023-11-26 DIAGNOSIS — H43393 Other vitreous opacities, bilateral: Secondary | ICD-10-CM | POA: Diagnosis not present

## 2023-11-26 DIAGNOSIS — H52203 Unspecified astigmatism, bilateral: Secondary | ICD-10-CM | POA: Diagnosis not present

## 2023-11-26 DIAGNOSIS — H40013 Open angle with borderline findings, low risk, bilateral: Secondary | ICD-10-CM | POA: Diagnosis not present

## 2023-11-29 DIAGNOSIS — F4322 Adjustment disorder with anxiety: Secondary | ICD-10-CM | POA: Diagnosis not present

## 2023-12-01 DIAGNOSIS — M25572 Pain in left ankle and joints of left foot: Secondary | ICD-10-CM | POA: Diagnosis not present

## 2023-12-07 DIAGNOSIS — S93492A Sprain of other ligament of left ankle, initial encounter: Secondary | ICD-10-CM | POA: Diagnosis not present

## 2023-12-12 ENCOUNTER — Encounter (HOSPITAL_BASED_OUTPATIENT_CLINIC_OR_DEPARTMENT_OTHER): Payer: Self-pay | Admitting: Orthopaedic Surgery

## 2023-12-13 DIAGNOSIS — F4322 Adjustment disorder with anxiety: Secondary | ICD-10-CM | POA: Diagnosis not present

## 2023-12-17 NOTE — H&P (Signed)
 ORTHOPAEDIC SURGERY H&P  Subjective:  The patient presents with left ankle instability.   Past Medical History:  Diagnosis Date   Acne    Allergic rhinitis    Closed fracture of nasal bone    Deviated nasal septum    traumatic   Glaucoma suspect    Glaucoma suspect of both eyes    Headache    PCOS (polycystic ovarian syndrome) 07/27/2017    Past Surgical History:  Procedure Laterality Date   NASAL SEPTUM SURGERY  03/27/2018   R inferior turbinate outfracture     (Not in an outpatient encounter)    Allergies  Allergen Reactions   Metformin Hcl     Gi upset    Social History   Socioeconomic History   Marital status: Single    Spouse name: Not on file   Number of children: 0   Years of education: Not on file   Highest education level: Not on file  Occupational History   Occupation: RN [employer]  Tobacco Use   Smoking status: Never   Smokeless tobacco: Never  Vaping Use   Vaping status: Never Used  Substance and Sexual Activity   Alcohol use: Yes    Comment: social   Drug use: No   Sexual activity: Not on file  Other Topics Concern   Not on file  Social History Narrative   RN   Mother is Mrs Toniann Fail   Lives w/ mother    Social Drivers of Health   Financial Resource Strain: Not on file  Food Insecurity: Low Risk  (12/18/2022)   Received from [facility]   Hunger Vital Sign    Worried About Running Out of Food in the Last Year: Never true    Ran Out of Food in the Last Year: Never true  Transportation Needs: Not on file (12/18/2022)  Physical Activity: Not on file  Stress: Not on file  Social Connections: Not on file  Intimate Partner Violence: Not on file     History reviewed. No pertinent family history.   Review of Systems Pertinent items are noted in HPI.  Objective: Vital signs in last 24 hours:    12/12/2023   12:17 PM 06/15/2023    1:04 PM 02/07/2021    1:16 PM  Vitals with BMI  Height 5\' 6"  5\' 5"    Weight 210 lbs 210 lbs   BMI 33.91  34.95   Pulse   86      EXAM: General: Well nourished, well developed. Awake, alert and oriented to time, place, person. Normal mood and affect. No apparent distress. Breathing room air.  Operative Lower Extremity: Alignment - Neutral Deformity - None Skin intact Tenderness to palpation - LLC and peroneal tendons 5/5 TA, PT, GS, Per, EHL, FHL Sensation intact to light touch throughout Palpable DP and PT pulses Special testing: None  The contralateral foot/ankle was examined for comparison and noted to be neurovascularly intact with no localized deformity, swelling, or tenderness.  Imaging Review All images taken were independently reviewed by me.  Assessment/Plan: The clinical and radiographic findings were reviewed and discussed at length with the patient.  The patient has left lateral ankle instability.  We spoke at length about the natural course of these findings. We discussed nonoperative and operative treatment options in detail.  The risks and benefits were presented and reviewed. The risks due to hardware failure/irritation, new/persistent/recurrent infection, stiffness, nerve/vessel/tendon injury, nonunion/malunion of any fracture, wound healing issues, allograft usage, development of arthritis, failure of  this surgery, possibility of external fixation in certain situations, possibility of delayed definitive surgery, need for further surgery, prolonged wound care including further soft tissue coverage procedures, thromboembolic events, anesthesia/medical complications/events perioperatively and beyond, amputation, death among others were discussed. The patient acknowledged the explanation and agreed to proceed with the plan.  Karen Lang  Orthopaedic Surgery [facility]

## 2023-12-17 NOTE — Discharge Instructions (Signed)
 Netta Cedars, MD [facility]  Please read the following information regarding your care after surgery.  Medications  You only need a prescription for the narcotic pain medicine (ex. oxycodone, Percocet, Norco).  All of the other medicines listed below are available over the counter. ? Aleve 2 pills twice a day for the first 3 days after surgery. ? acetominophen (Tylenol) 650 mg every 4-6 hours as you need for minor to moderate pain ? oxycodone as prescribed for severe pain  ? To help prevent blood clots, take aspirin (81 mg) twice daily for 28 days after surgery.  You should also get up every hour while you are awake to move around.  Weight Bearing ? OK to walk on the operative leg only AFTER the nerve block has completely worn off.  Cast / Splint / Dressing ? Keep your dressing clean and dry.  Don't put anything (coat hanger, pencil, etc) down inside of it.  If it gets wet, please notify the office immediately.  Swelling IMPORTANT: It is normal for you to have swelling where you had surgery. To reduce swelling and pain, keep at least 3 pillows under your leg so that your toes are above your nose and your heel is above the level of your hip.  It may be necessary to keep your foot or leg elevated for several weeks.  This is critical to helping your incisions heal and your pain to feel better.  Follow Up Call my office at [phone] when you are discharged from the hospital or surgery center to schedule an appointment to be seen within 7-10 days after surgery.  Call my office at [phone] if you develop a fever >101.5 F, nausea, vomiting, bleeding from the surgical site or severe pain.   Post Anesthesia Home Care Instructions  Activity: Get plenty of rest for the remainder of the day. A responsible individual must stay with you for 24 hours following the procedure.  For the next 24 hours, DO NOT: -Drive a car -Advertising copywriter -Drink alcoholic beverages -Take any  medication unless instructed by your physician -Make any legal decisions or sign important papers.  Meals: Start with liquid foods such as gelatin or soup. Progress to regular foods as tolerated. Avoid greasy, spicy, heavy foods. If nausea and/or vomiting occur, drink only clear liquids until the nausea and/or vomiting subsides. Call your physician if vomiting continues.  Special Instructions/Symptoms: Your throat may feel dry or sore from the anesthesia or the breathing tube placed in your throat during surgery. If this causes discomfort, gargle with warm salt water. The discomfort should disappear within 24 hours.  If you had a scopolamine patch placed behind your ear for the management of post- operative nausea and/or vomiting:  1. The medication in the patch is effective for 72 hours, after which it should be removed.  Wrap patch in a tissue and discard in the trash. Wash hands thoroughly with soap and water. 2. You may remove the patch earlier than 72 hours if you experience unpleasant side effects which may include dry mouth, dizziness or visual disturbances. 3. Avoid touching the patch. Wash your hands with soap and water after contact with the patch.    Post Anesthesia Home Care Instructions  Activity: Get plenty of rest for the remainder of the day. A responsible individual must stay with you for 24 hours following the procedure.  For the next 24 hours, DO NOT: -Drive a car -Advertising copywriter -Drink alcoholic beverages -Take any medication unless instructed by your

## 2023-12-18 ENCOUNTER — Other Ambulatory Visit (HOSPITAL_BASED_OUTPATIENT_CLINIC_OR_DEPARTMENT_OTHER): Payer: Self-pay

## 2023-12-18 DIAGNOSIS — M9903 Segmental and somatic dysfunction of lumbar region: Secondary | ICD-10-CM | POA: Diagnosis not present

## 2023-12-18 DIAGNOSIS — M9906 Segmental and somatic dysfunction of lower extremity: Secondary | ICD-10-CM | POA: Diagnosis not present

## 2023-12-18 DIAGNOSIS — M9905 Segmental and somatic dysfunction of pelvic region: Secondary | ICD-10-CM | POA: Diagnosis not present

## 2023-12-18 DIAGNOSIS — M9902 Segmental and somatic dysfunction of thoracic region: Secondary | ICD-10-CM | POA: Diagnosis not present

## 2023-12-18 DIAGNOSIS — M9901 Segmental and somatic dysfunction of cervical region: Secondary | ICD-10-CM | POA: Diagnosis not present

## 2023-12-18 MED ORDER — DOCUSATE SODIUM 100 MG PO CAPS
100.0000 mg | ORAL_CAPSULE | Freq: Two times a day (BID) | ORAL | 0 refills | Status: DC
  Filled 2023-12-18: qty 100, 50d supply, fill #0

## 2023-12-18 MED ORDER — ASPIRIN 81 MG PO TBEC
81.0000 mg | DELAYED_RELEASE_TABLET | Freq: Two times a day (BID) | ORAL | 1 refills | Status: DC
  Filled 2023-12-18: qty 84, 42d supply, fill #0

## 2023-12-18 MED ORDER — OXYCODONE HCL 5 MG PO TABS
5.0000 mg | ORAL_TABLET | ORAL | 0 refills | Status: DC
  Filled 2023-12-18: qty 40, 7d supply, fill #0

## 2023-12-18 MED ORDER — ONDANSETRON 4 MG PO TBDP
4.0000 mg | ORAL_TABLET | Freq: Three times a day (TID) | ORAL | 0 refills | Status: DC | PRN
  Filled 2023-12-18: qty 15, 5d supply, fill #0

## 2023-12-19 ENCOUNTER — Ambulatory Visit (HOSPITAL_BASED_OUTPATIENT_CLINIC_OR_DEPARTMENT_OTHER): Admitting: Anesthesiology

## 2023-12-19 ENCOUNTER — Ambulatory Visit (HOSPITAL_BASED_OUTPATIENT_CLINIC_OR_DEPARTMENT_OTHER)
Admission: RE | Admit: 2023-12-19 | Discharge: 2023-12-19 | Disposition: A | Discharge status: Home / Self Care | Payer: Self-pay | Attending: Orthopaedic Surgery | Admitting: Orthopaedic Surgery

## 2023-12-19 ENCOUNTER — Encounter (HOSPITAL_BASED_OUTPATIENT_CLINIC_OR_DEPARTMENT_OTHER): Payer: Self-pay | Admitting: Orthopaedic Surgery

## 2023-12-19 ENCOUNTER — Other Ambulatory Visit: Payer: Self-pay

## 2023-12-19 ENCOUNTER — Encounter (HOSPITAL_BASED_OUTPATIENT_CLINIC_OR_DEPARTMENT_OTHER)
Admission: RE | Disposition: A | Discharge status: Home / Self Care | Payer: Self-pay | Source: Home / Self Care | Attending: Orthopaedic Surgery

## 2023-12-19 DIAGNOSIS — E039 Hypothyroidism, unspecified: Secondary | ICD-10-CM | POA: Insufficient documentation

## 2023-12-19 DIAGNOSIS — M7672 Peroneal tendinitis, left leg: Secondary | ICD-10-CM | POA: Diagnosis not present

## 2023-12-19 DIAGNOSIS — M25372 Other instability, left ankle: Secondary | ICD-10-CM | POA: Diagnosis not present

## 2023-12-19 DIAGNOSIS — Z01818 Encounter for other preprocedural examination: Secondary | ICD-10-CM

## 2023-12-19 DIAGNOSIS — X58XXXA Exposure to other specified factors, initial encounter: Secondary | ICD-10-CM | POA: Diagnosis not present

## 2023-12-19 DIAGNOSIS — S93492A Sprain of other ligament of left ankle, initial encounter: Secondary | ICD-10-CM | POA: Diagnosis not present

## 2023-12-19 DIAGNOSIS — G8918 Other acute postprocedural pain: Secondary | ICD-10-CM | POA: Diagnosis not present

## 2023-12-19 DIAGNOSIS — R519 Headache, unspecified: Secondary | ICD-10-CM | POA: Insufficient documentation

## 2023-12-19 HISTORY — PX: ARTHROSCOPY, ANKLE WITH DEBRIDEMENT: SHX7318

## 2023-12-19 HISTORY — PX: LIGAMENT REPAIR: SHX5444

## 2023-12-19 LAB — POCT PREGNANCY, URINE: Preg Test, Ur: NEGATIVE

## 2023-12-19 SURGERY — ARTHROSCOPY, ANKLE WITH DEBRIDEMENT
Anesthesia: General | Site: Ankle | Laterality: Left

## 2023-12-19 MED ORDER — MIDAZOLAM HCL 5 MG/5ML IJ SOLN
INTRAMUSCULAR | Status: DC | PRN
  Administered 2023-12-19: 2 mg via INTRAVENOUS

## 2023-12-19 MED ORDER — OXYCODONE HCL 5 MG/5ML PO SOLN: 5.0000 mg | Freq: Once | ORAL | Status: AC | PRN

## 2023-12-19 MED ORDER — LIDOCAINE 2% (20 MG/ML) 5 ML SYRINGE
INTRAMUSCULAR | Status: DC | PRN
  Administered 2023-12-19: 40 mg via INTRAVENOUS

## 2023-12-19 MED ORDER — FENTANYL CITRATE (PF) 100 MCG/2ML IJ SOLN
INTRAMUSCULAR | Status: AC
  Filled 2023-12-19: qty 2

## 2023-12-19 MED ORDER — PROPOFOL 10 MG/ML IV BOLUS
INTRAVENOUS | Status: AC
Start: 2023-12-19 — End: ?
  Filled 2023-12-19: qty 20

## 2023-12-19 MED ORDER — ONDANSETRON HCL 4 MG/2ML IJ SOLN
INTRAMUSCULAR | Status: DC | PRN
  Administered 2023-12-19: 4 mg via INTRAVENOUS

## 2023-12-19 MED ORDER — SODIUM CHLORIDE 0.9 % IR SOLN
Status: DC | PRN
  Administered 2023-12-19: 200 mL

## 2023-12-19 MED ORDER — PROPOFOL 10 MG/ML IV BOLUS
INTRAVENOUS | Status: DC | PRN
  Administered 2023-12-19: 200 mg via INTRAVENOUS

## 2023-12-19 MED ORDER — MIDAZOLAM HCL 2 MG/2ML IJ SOLN
INTRAMUSCULAR | Status: AC
  Filled 2023-12-19: qty 2

## 2023-12-19 MED ORDER — OXYCODONE HCL 5 MG PO TABS
5.0000 mg | ORAL_TABLET | Freq: Once | ORAL | Status: AC | PRN
  Administered 2023-12-19: 5 mg via ORAL

## 2023-12-19 MED ORDER — ROPIVACAINE HCL 5 MG/ML IJ SOLN
INTRAMUSCULAR | Status: DC | PRN
Start: 2023-12-19 — End: 2023-12-19
  Administered 2023-12-19: 30 mL via PERINEURAL

## 2023-12-19 MED ORDER — HYDROMORPHONE HCL 1 MG/ML IJ SOLN
0.2500 mg | INTRAMUSCULAR | Status: DC | PRN
  Administered 2023-12-19 (×2): 0.5 mg via INTRAVENOUS

## 2023-12-19 MED ORDER — DEXMEDETOMIDINE HCL IN NACL 80 MCG/20ML IV SOLN
INTRAVENOUS | Status: AC
  Filled 2023-12-19: qty 20

## 2023-12-19 MED ORDER — POVIDONE-IODINE 10 % EX SOLN
CUTANEOUS | Status: DC | PRN
  Administered 2023-12-19: 1 via TOPICAL

## 2023-12-19 MED ORDER — SODIUM CHLORIDE 0.9 % IV SOLN: 12.5000 mg | INTRAVENOUS | Status: DC | PRN

## 2023-12-19 MED ORDER — OXYCODONE HCL 5 MG PO TABS
ORAL_TABLET | ORAL | Status: AC
  Filled 2023-12-19: qty 1

## 2023-12-19 MED ORDER — LIDOCAINE 2% (20 MG/ML) 5 ML SYRINGE
INTRAMUSCULAR | Status: AC
  Filled 2023-12-19: qty 5

## 2023-12-19 MED ORDER — VANCOMYCIN HCL 500 MG IV SOLR
INTRAVENOUS | Status: DC | PRN
  Administered 2023-12-19: 500 mg via TOPICAL

## 2023-12-19 MED ORDER — DEXAMETHASONE SODIUM PHOSPHATE 10 MG/ML IJ SOLN
INTRAMUSCULAR | Status: DC | PRN
  Administered 2023-12-19: 10 mg via INTRAVENOUS

## 2023-12-19 MED ORDER — CHLORHEXIDINE GLUCONATE 4 % EX SOLN: 60.0000 mL | Freq: Once | CUTANEOUS | Status: DC

## 2023-12-19 MED ORDER — LACTATED RINGERS IV SOLN: INTRAVENOUS | Status: DC

## 2023-12-19 MED ORDER — CEFAZOLIN SODIUM-DEXTROSE 2-4 GM/100ML-% IV SOLN
2.0000 g | INTRAVENOUS | Status: AC
  Administered 2023-12-19: 2 g via INTRAVENOUS

## 2023-12-19 MED ORDER — ONDANSETRON HCL 4 MG/2ML IJ SOLN
INTRAMUSCULAR | Status: AC
Start: 2023-12-19 — End: ?
  Filled 2023-12-19: qty 2

## 2023-12-19 MED ORDER — AMISULPRIDE (ANTIEMETIC) 5 MG/2ML IV SOLN: 10.0000 mg | Freq: Once | INTRAVENOUS | Status: DC | PRN

## 2023-12-19 MED ORDER — FENTANYL CITRATE (PF) 100 MCG/2ML IJ SOLN
INTRAMUSCULAR | Status: DC | PRN
  Administered 2023-12-19: 50 ug via INTRAVENOUS

## 2023-12-19 MED ORDER — FENTANYL CITRATE (PF) 100 MCG/2ML IJ SOLN
100.0000 ug | Freq: Once | INTRAMUSCULAR | Status: AC
  Administered 2023-12-19: 100 ug via INTRAVENOUS

## 2023-12-19 MED ORDER — MIDAZOLAM HCL 2 MG/2ML IJ SOLN
2.0000 mg | Freq: Once | INTRAMUSCULAR | Status: AC
  Administered 2023-12-19: 2 mg via INTRAVENOUS

## 2023-12-19 MED ORDER — DEXAMETHASONE SODIUM PHOSPHATE 10 MG/ML IJ SOLN
INTRAMUSCULAR | Status: AC
  Filled 2023-12-19: qty 1

## 2023-12-19 MED ORDER — HYDROMORPHONE HCL 1 MG/ML IJ SOLN
INTRAMUSCULAR | Status: AC
  Filled 2023-12-19: qty 0.5

## 2023-12-19 MED ORDER — CEFAZOLIN SODIUM-DEXTROSE 2-4 GM/100ML-% IV SOLN
INTRAVENOUS | Status: AC
  Filled 2023-12-19: qty 100

## 2023-12-19 SURGICAL SUPPLY — 62 items
ANCHOR SUT FBRTK 1.3 SUTTAP (Anchor) IMPLANT
BANDAGE ESMARK 6X9 LF (GAUZE/BANDAGES/DRESSINGS) IMPLANT
BLADE SURG 15 STRL LF DISP TIS (BLADE) ×2 IMPLANT
BNDG ELASTIC 4INX 5YD STR LF (GAUZE/BANDAGES/DRESSINGS) ×2 IMPLANT
BNDG ELASTIC 6X10 VLCR STRL LF (GAUZE/BANDAGES/DRESSINGS) ×2 IMPLANT
BNDG ESMARK 6X9 LF (GAUZE/BANDAGES/DRESSINGS) IMPLANT
BNDG GAUZE DERMACEA FLUFF 4 (GAUZE/BANDAGES/DRESSINGS) ×2 IMPLANT
BOOT STEPPER DURA LG (SOFTGOODS) IMPLANT
BOOT STEPPER DURA MED (SOFTGOODS) IMPLANT
BOOT STEPPER DURA SM (SOFTGOODS) IMPLANT
BURR OVAL 8 FLU 4.0X13 (MISCELLANEOUS) IMPLANT
CHLORAPREP W/TINT 26 (MISCELLANEOUS) IMPLANT
CUFF TRNQT CYL 34X4.125X (TOURNIQUET CUFF) IMPLANT
DISSECTOR 3.8MM X 13CM (MISCELLANEOUS) IMPLANT
DRAPE EXTREMITY T 121X128X90 (DISPOSABLE) ×2 IMPLANT
DRAPE OEC MINIVIEW 54X84 (DRAPES) IMPLANT
DRAPE U-SHAPE 47X51 STRL (DRAPES) ×2 IMPLANT
DRSG MEPITEL 4X7.2 (GAUZE/BANDAGES/DRESSINGS) ×2 IMPLANT
ELECT REM PT RETURN 9FT ADLT (ELECTROSURGICAL) ×2 IMPLANT
ELECTRODE REM PT RTRN 9FT ADLT (ELECTROSURGICAL) ×2 IMPLANT
EXCALIBUR 3.8MM X 13CM (MISCELLANEOUS) IMPLANT
GAUZE PAD ABD 8X10 STRL (GAUZE/BANDAGES/DRESSINGS) ×4 IMPLANT
GAUZE SPONGE 4X4 12PLY STRL (GAUZE/BANDAGES/DRESSINGS) ×2 IMPLANT
GAUZE XEROFORM 1X8 LF (GAUZE/BANDAGES/DRESSINGS) ×2 IMPLANT
GLOVE BIOGEL PI IND STRL 8 (GLOVE) ×2 IMPLANT
GLOVE SURG SS PI 7.5 STRL IVOR (GLOVE) ×2 IMPLANT
GOWN STRL REUS W/ TWL LRG LVL3 (GOWN DISPOSABLE) ×2 IMPLANT
GOWN STRL REUS W/ TWL XL LVL3 (GOWN DISPOSABLE) ×4 IMPLANT
KIT FIBERTAK DX 1.6 DISP (KITS) IMPLANT
MANIFOLD NEPTUNE II (INSTRUMENTS) ×2 IMPLANT
NANONEEDLE HIGHFLOW SHEATH 125 (SHEATH) ×2 IMPLANT
NDL SUT 6 .5 CRC .975X.05 MAYO (NEEDLE) IMPLANT
NS IRRIG 1000ML POUR BTL (IV SOLUTION) IMPLANT
PACK ARTHROSCOPY DSU (CUSTOM PROCEDURE TRAY) ×2 IMPLANT
PACK BASIN DAY SURGERY FS (CUSTOM PROCEDURE TRAY) ×2 IMPLANT
PAD CAST 4YDX4 CTTN HI CHSV (CAST SUPPLIES) ×2 IMPLANT
PADDING CAST COTTON 6X4 STRL (CAST SUPPLIES) IMPLANT
PENCIL SMOKE EVACUATOR (MISCELLANEOUS) IMPLANT
SANITIZER HAND PURELL FF 515ML (MISCELLANEOUS) ×2 IMPLANT
SCOPE NANONDL 125 (MISCELLANEOUS) IMPLANT
SCOPE NANONEEDLE 125 (MISCELLANEOUS) ×2 IMPLANT
SET IRRIG Y TYPE TUR BLADDER L (SET/KITS/TRAYS/PACK) ×2 IMPLANT
SHAVER DISSECTOR 3.0 (BURR) IMPLANT
SHAVER SABRE 2.0 (BURR) IMPLANT
SHEATH NANONDL HIGHFLOW 125 (SHEATH) IMPLANT
SHEATH NANONEEDLE HIGHFLOW 125 (SHEATH) ×2 IMPLANT
SLEEVE SCD COMPRESS KNEE MED (STOCKING) ×2 IMPLANT
SOL .9 NS 3000ML IRR UROMATIC (IV SOLUTION) ×2 IMPLANT
SPLINT PLASTER CAST FAST 5X30 (CAST SUPPLIES) IMPLANT
SPONGE T-LAP 18X18 ~~LOC~~+RFID (SPONGE) ×2 IMPLANT
STOCKINETTE 6 STRL (DRAPES) ×2 IMPLANT
STRAP ANKLE FOOT DISTRACTOR (ORTHOPEDIC SUPPLIES) IMPLANT
SUCTION TUBE FRAZIER 10FR DISP (SUCTIONS) ×2 IMPLANT
SUT ETHILON 2 0 FS 18 (SUTURE) ×2 IMPLANT
SUT VIC AB 2-0 CT1 TAPERPNT 27 (SUTURE) IMPLANT
SUT VIC AB 3-0 SH 27X BRD (SUTURE) IMPLANT
SYR BULB EAR ULCER 3OZ GRN STR (SYRINGE) ×2 IMPLANT
TOWEL GREEN STERILE FF (TOWEL DISPOSABLE) ×2 IMPLANT
TUBE CONNECTING 20X1/4 (TUBING) IMPLANT
TUBING ARTHROSCOPY IRRIG 16FT (MISCELLANEOUS) ×2 IMPLANT
WAND ABLATOR APOLLO I90 (BUR) IMPLANT
WATER STERILE IRR 1000ML POUR (IV SOLUTION) ×2 IMPLANT

## 2023-12-19 NOTE — Progress Notes (Signed)
Assisted Dr. Miller with left, popliteal, ultrasound guided block. Side rails up, monitors on throughout procedure. See vital signs in flow sheet. Tolerated Procedure well. 

## 2023-12-19 NOTE — Anesthesia Postprocedure Evaluation (Signed)
 Anesthesia Post Note  Patient: Karen Lang  Procedure(s) Performed: ARTHROSCOPY, ANKLE WITH DEBRIDEMENT (Left: Ankle) REPAIR, LIGAMENT (Left)     Patient location during evaluation: PACU Anesthesia Type: General Level of consciousness: awake and alert Pain management: pain level controlled Vital Signs Assessment: post-procedure vital signs reviewed and stable Respiratory status: spontaneous breathing, nonlabored ventilation and respiratory function stable Cardiovascular status: blood pressure returned to baseline and stable Postop Assessment: no apparent nausea or vomiting Anesthetic complications: no   No notable events documented.  Last Vitals:  Vitals:   12/19/23 1215 12/19/23 1236  BP: (!) 136/97 (!) 146/97  Pulse: 94 98  Resp: 16 20  Temp:  36.9 C  SpO2: 96% 95%    Last Pain:  Vitals:   12/19/23 1236  TempSrc: Temporal  PainSc:                  Lowella Curb

## 2023-12-19 NOTE — Anesthesia Preprocedure Evaluation (Signed)
 Anesthesia Evaluation  Patient identified by MRN, date of birth, ID band Patient awake    Reviewed: Allergy & Precautions, H&P , NPO status , Patient's Chart, lab work & pertinent test results  Airway Mallampati: II  TM Distance: >3 FB Neck ROM: Full    Dental no notable dental hx.    Pulmonary neg pulmonary ROS   Pulmonary exam normal breath sounds clear to auscultation       Cardiovascular negative cardio ROS Normal cardiovascular exam Rhythm:Regular Rate:Normal     Neuro/Psych  Headaches  negative psych ROS   GI/Hepatic negative GI ROS, Neg liver ROS,,,  Endo/Other  Hypothyroidism    Renal/GU negative Renal ROS  negative genitourinary   Musculoskeletal negative musculoskeletal ROS (+)    Abdominal   Peds negative pediatric ROS (+)  Hematology negative hematology ROS (+)   Anesthesia Other Findings   Reproductive/Obstetrics negative OB ROS                             Anesthesia Physical Anesthesia Plan  ASA: 2  Anesthesia Plan: General   Post-op Pain Management: Regional block*   Induction: Intravenous  PONV Risk Score and Plan: 3 and Ondansetron, Dexamethasone, Midazolam and Treatment may vary due to age or medical condition  Airway Management Planned: LMA  Additional Equipment:   Intra-op Plan:   Post-operative Plan: Extubation in OR  Informed Consent: I have reviewed the patients History and Physical, chart, labs and discussed the procedure including the risks, benefits and alternatives for the proposed anesthesia with the patient or authorized representative who has indicated his/her understanding and acceptance.     Dental advisory given  Plan Discussed with: CRNA  Anesthesia Plan Comments:        Anesthesia Quick Evaluation

## 2023-12-19 NOTE — H&P (Signed)

## 2023-12-19 NOTE — Anesthesia Procedure Notes (Signed)
 Procedure Name: LMA Insertion Date/Time: 12/19/2023 10:38 AM  Performed by: Roosvelt Harps, CRNAPre-anesthesia Checklist: Patient identified, Emergency Drugs available, Suction available and Patient being monitored Patient Re-evaluated:Patient Re-evaluated prior to induction Oxygen Delivery Method: Circle System Utilized Preoxygenation: Pre-oxygenation with 100% oxygen Induction Type: IV induction Ventilation: Mask ventilation without difficulty LMA: LMA inserted LMA Size: 4.0 Number of attempts: 1 Airway Equipment and Method: Bite block Placement Confirmation: positive ETCO2 Tube secured with: Tape Dental Injury: Teeth and Oropharynx as per pre-operative assessment

## 2023-12-19 NOTE — Op Note (Signed)
 12/19/2023  11:30 AM   PATIENT: Karen Lang  33 y.o. 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   INDICATION FOR PROCEDURE: The patient presented with above diagnosis.  We discussed the diagnosis, alternative treatment options, risks and benefits of the above surgical intervention, as well as alternative non-operative treatments. All questions/concerns were addressed and the patient/family demonstrated appropriate understanding of the diagnosis, the procedure, the postoperative course, and overall prognosis. The patient wished to proceed with surgical intervention and signed an informed surgical consent as such, in each others presence prior to surgery.   PROCEDURE IN DETAIL: After preoperative consent was obtained and the correct operative site was identified, the patient was brought to the operating room supine on stretcher and transferred onto operating table. General anesthesia was induced. Preoperative antibiotics were administered. Surgical timeout was taken. The patient was then positioned supine with an ipsilateral hip bump. The operative lower extremity was prepped and draped in standard sterile fashion with a tourniquet around the thigh. The extremity was exsanguinated and the tourniquet was inflated to 275  mmHg.  Routine evaluation of the ankle joint demonstrated gross lateral laxity with drawer testing. Full dorsiflexion as well as plantarflexion was possible.   We began by insufflating the ankle joint thru anteromedial approach. The anteromedial portal was carefully established medial to the tibialis anterior tendon. The arthroscopic trochar with blunt was inserted and then camera placed. There was excellent visualization of the joint and routine diagnostic ankle arthroscopy was performed. Of note, there was mild synovitis throughout the joint noted. Mild chondral changes in the tibiotalar joint surfaces. No loose bodies were encountered and no anterior ankle impingement was identified on max dorsiflexion. The deltoid and syndesmosis ligaments were stressed and noted to be stable. Arthroscopic assisted debridement of the ankle joint was performed.    A standard curvilinear approach was made over the lateral ankle ligament complex and the distal lateral malleolus.    We began by windowing the approach posteriorly to access the peroneal tendons. The sheath was incised and tenosynovial fluid was readily evacuated. We then sequentially evaluated both the peroneus longus and brevis tendons with tearing noted in the latter that was repaired. We did note extensive tenosynovitis that was debrided thoroughly. There was no instability of peroneal tendons to intraoperative stress testing. The peroneal tendon sheath was closed with vicryl.    Dissection was the carried down to the level of the lateral ankle ligament complex. We sharply incised the capsule and the complex just distal to the tip of the fibula leaving a small cuff of tissue for repair after advancement. The proximal flap was elevated carefully off the lateral malleolus. We then used a rongeur to roughen the distal lateral malleolus. Two Arthrex FiberTak anchors were implanted using standard technique in the anatomic footprints of the ATFL and CFL  ligaments. These were verified by manual stress to be  well seated within bone. The suture needles were sequentially passed through the distal flap, tied, and then brought back proximally into the proximal flap were they were then tied.The foot was held in eversion throughout to set appropriate tension and protect the repair. Intraoperative ankle testing demonstrated improved stability of the newly reconstructed lateral ankle ligament complex.   The surgical sites were thoroughly irrigated. The tourniquet was deflated and hemostasis achieved. Betadine and vancomycin powder were applied. The deep layers were closed using 2-0 vicryl. The skin was closed without tension using 2-0 nylon suture.    The leg was cleaned with saline and sterile dressings with gauze were applied. A well padded bulky short leg splint was applied. The patient was awakened from anesthesia and transported to the recovery room in stable condition.     FOLLOW UP PLAN: -transfer to PACU, then home -strict NWB operative extremity until nerve block wears off and then ok to be protected WB in boot at all times, maximum elevation -maintain dressings until follow up -DVT ppx: Aspirin 81 mg twice daily while NWB -follow up as outpatient within 7-10 days for wound check -sutures out in 2-3 weeks in outpatient office   RADIOGRAPHS: None   Netta Cedars [facility]

## 2023-12-19 NOTE — Transfer of Care (Signed)
 Immediate Anesthesia Transfer of Care Note  Patient: Karen Lang  Procedure(s) Performed: ARTHROSCOPY, ANKLE WITH DEBRIDEMENT (Left: Ankle) REPAIR, LIGAMENT (Left)  Patient Location: PACU  Anesthesia Type:General  Level of Consciousness: drowsy  Airway & Oxygen Therapy: Patient Spontanous Breathing and Patient connected to face mask oxygen  Post-op Assessment: Report given to RN and Post -op Vital signs reviewed and stable  Post vital signs: Reviewed and stable  Last Vitals:  Vitals Value Taken Time  BP    Temp    Pulse    Resp    SpO2      Last Pain:  Vitals:   12/19/23 0817  TempSrc: Tympanic  PainSc: 0-No pain      Patients Stated Pain Goal: 7 (12/19/23 0817)  Complications: No notable events documented.

## 2023-12-19 NOTE — Anesthesia Procedure Notes (Signed)
 Anesthesia Regional Block: Popliteal block   Pre-Anesthetic Checklist: , timeout performed,  Correct Patient, Correct Site, Correct Laterality,  Correct Procedure, Correct Position, site marked,  Risks and benefits discussed,  Surgical consent,  Pre-op evaluation,  At surgeon's request and post-op pain management  Laterality: Left  Prep: chloraprep       Needles:  Injection technique: Single-shot  Needle Type: Stimiplex     Needle Length: 9cm  Needle Gauge: 21     Additional Needles:   Procedures:,,,, ultrasound used (permanent image in chart),,    Narrative:  Start time: 12/19/2023 10:20 AM End time: 12/19/2023 10:25 AM Injection made incrementally with aspirations every 5 mL.  Performed by: Personally  Anesthesiologist: Lowella Curb, MD

## 2023-12-20 ENCOUNTER — Encounter (HOSPITAL_BASED_OUTPATIENT_CLINIC_OR_DEPARTMENT_OTHER): Payer: Self-pay | Admitting: Orthopaedic Surgery

## 2023-12-27 DIAGNOSIS — S93492D Sprain of other ligament of left ankle, subsequent encounter: Secondary | ICD-10-CM | POA: Diagnosis not present

## 2024-01-08 DIAGNOSIS — F4322 Adjustment disorder with anxiety: Secondary | ICD-10-CM | POA: Diagnosis not present

## 2024-01-15 DIAGNOSIS — F4322 Adjustment disorder with anxiety: Secondary | ICD-10-CM | POA: Diagnosis not present

## 2024-01-22 ENCOUNTER — Other Ambulatory Visit (HOSPITAL_BASED_OUTPATIENT_CLINIC_OR_DEPARTMENT_OTHER): Payer: Self-pay

## 2024-01-22 DIAGNOSIS — F4322 Adjustment disorder with anxiety: Secondary | ICD-10-CM | POA: Diagnosis not present

## 2024-01-22 MED ORDER — CEPHALEXIN 500 MG PO CAPS
500.0000 mg | ORAL_CAPSULE | Freq: Four times a day (QID) | ORAL | 0 refills | Status: DC
  Filled 2024-01-22: qty 56, 14d supply, fill #0

## 2024-01-25 DIAGNOSIS — S93492A Sprain of other ligament of left ankle, initial encounter: Secondary | ICD-10-CM | POA: Diagnosis not present

## 2024-01-25 DIAGNOSIS — S93492D Sprain of other ligament of left ankle, subsequent encounter: Secondary | ICD-10-CM | POA: Diagnosis not present

## 2024-01-29 DIAGNOSIS — F4322 Adjustment disorder with anxiety: Secondary | ICD-10-CM | POA: Diagnosis not present

## 2024-01-30 DIAGNOSIS — M9905 Segmental and somatic dysfunction of pelvic region: Secondary | ICD-10-CM | POA: Diagnosis not present

## 2024-01-30 DIAGNOSIS — M9906 Segmental and somatic dysfunction of lower extremity: Secondary | ICD-10-CM | POA: Diagnosis not present

## 2024-01-30 DIAGNOSIS — M9903 Segmental and somatic dysfunction of lumbar region: Secondary | ICD-10-CM | POA: Diagnosis not present

## 2024-01-30 DIAGNOSIS — M9901 Segmental and somatic dysfunction of cervical region: Secondary | ICD-10-CM | POA: Diagnosis not present

## 2024-01-30 DIAGNOSIS — M9902 Segmental and somatic dysfunction of thoracic region: Secondary | ICD-10-CM | POA: Diagnosis not present

## 2024-01-31 ENCOUNTER — Other Ambulatory Visit (HOSPITAL_BASED_OUTPATIENT_CLINIC_OR_DEPARTMENT_OTHER): Payer: Self-pay

## 2024-02-01 ENCOUNTER — Other Ambulatory Visit (HOSPITAL_BASED_OUTPATIENT_CLINIC_OR_DEPARTMENT_OTHER): Payer: Self-pay

## 2024-02-01 MED ORDER — FLUCONAZOLE 150 MG PO TABS
150.0000 mg | ORAL_TABLET | ORAL | 0 refills | Status: DC
  Filled 2024-02-01: qty 3, 9d supply, fill #0

## 2024-02-04 ENCOUNTER — Other Ambulatory Visit (HOSPITAL_BASED_OUTPATIENT_CLINIC_OR_DEPARTMENT_OTHER): Payer: Self-pay

## 2024-02-04 DIAGNOSIS — G4700 Insomnia, unspecified: Secondary | ICD-10-CM | POA: Diagnosis not present

## 2024-02-04 DIAGNOSIS — Z13220 Encounter for screening for lipoid disorders: Secondary | ICD-10-CM | POA: Diagnosis not present

## 2024-02-04 DIAGNOSIS — E786 Lipoprotein deficiency: Secondary | ICD-10-CM | POA: Diagnosis not present

## 2024-02-04 DIAGNOSIS — Z0000 Encounter for general adult medical examination without abnormal findings: Secondary | ICD-10-CM | POA: Diagnosis not present

## 2024-02-04 DIAGNOSIS — D649 Anemia, unspecified: Secondary | ICD-10-CM | POA: Diagnosis not present

## 2024-02-04 DIAGNOSIS — E66811 Obesity, class 1: Secondary | ICD-10-CM | POA: Diagnosis not present

## 2024-02-04 DIAGNOSIS — E559 Vitamin D deficiency, unspecified: Secondary | ICD-10-CM | POA: Diagnosis not present

## 2024-02-04 MED ORDER — ZOLPIDEM TARTRATE 10 MG PO TABS
5.0000 mg | ORAL_TABLET | Freq: Every evening | ORAL | 0 refills | Status: AC
Start: 2024-02-04 — End: ?
  Filled 2024-02-04: qty 90, 90d supply, fill #0

## 2024-02-05 ENCOUNTER — Other Ambulatory Visit: Payer: Self-pay

## 2024-02-05 ENCOUNTER — Encounter (HOSPITAL_BASED_OUTPATIENT_CLINIC_OR_DEPARTMENT_OTHER): Payer: Self-pay | Admitting: Physical Therapy

## 2024-02-05 ENCOUNTER — Ambulatory Visit (HOSPITAL_BASED_OUTPATIENT_CLINIC_OR_DEPARTMENT_OTHER): Attending: Orthopaedic Surgery | Admitting: Physical Therapy

## 2024-02-05 DIAGNOSIS — M25672 Stiffness of left ankle, not elsewhere classified: Secondary | ICD-10-CM | POA: Insufficient documentation

## 2024-02-05 DIAGNOSIS — M25572 Pain in left ankle and joints of left foot: Secondary | ICD-10-CM | POA: Insufficient documentation

## 2024-02-05 DIAGNOSIS — R2689 Other abnormalities of gait and mobility: Secondary | ICD-10-CM | POA: Diagnosis not present

## 2024-02-05 NOTE — Therapy (Signed)
 OUTPATIENT PHYSICAL THERAPY LOWER EXTREMITY EVALUATION   Patient Name: Karen Lang MRN: [id number] DOB:07-Nov-1990, 33 y.o., female Today's Date: 02/06/2024  END OF SESSION:  PT End of Session - 02/06/24 0734     Visit Number 1    Number of Visits 16    Date for PT Re-Evaluation 04/02/24    PT Start Time 1600    PT Stop Time 1645    PT Time Calculation (min) 45 min    Activity Tolerance Patient tolerated treatment well    Behavior During Therapy WFL for tasks assessed/performed             Past Medical History:  Diagnosis Date   Acne    Allergic rhinitis    Closed fracture of nasal bone    Deviated nasal septum    traumatic   Glaucoma suspect    Glaucoma suspect of both eyes    Headache    PCOS (polycystic ovarian syndrome) 07/27/2017   Past Surgical History:  Procedure Laterality Date   ARTHROSCOPY, ANKLE WITH DEBRIDEMENT Left 12/19/2023   Procedure: ARTHROSCOPY, ANKLE WITH DEBRIDEMENT;  Surgeon: Ali Ink, MD;  Location: [facility];  Service: Orthopedics;  Laterality: Left;   LIGAMENT REPAIR Left 12/19/2023   Procedure: REPAIR, LIGAMENT;  Surgeon: Ali Ink, MD;  Location: [facility];  Service: Orthopedics;  Laterality: Left;  peroneal tendon   NASAL SEPTUM SURGERY  03/27/2018   R inferior turbinate outfracture   Patient Active Problem List   Diagnosis Date Noted   Metabolic syndrome X 11/15/2020   Subclinical hypothyroidism 11/15/2020   Hirsutism 11/15/2020   PCOS (polycystic ovarian syndrome) 07/27/2017   Family history of glaucoma 06/11/2017   Glaucoma suspect of both eyes 06/11/2017   PCP NOTES >>>>>>>>>>>>>>>>>>>>> 09/14/2016   Annual physical exam 04/23/2014   Anemia 02/12/2013   Family history of thyroid  disease 02/12/2013   Allergic rhinitis 02/03/2013    PCP: Janelle Mediate DO   REFERRING PROVIDER: Ali Ink MD   REFERRING DIAG: Left Bronstrom repair and Peroneal repair  THERAPY DIAG:   Pain in left ankle and joints of left foot  Stiffness of left ankle, not elsewhere classified  Other abnormalities of gait and mobility  Rationale for Evaluation and Treatment: Rehabilitation  ONSET DATE: 12/19/2023  SUBJECTIVE:   SUBJECTIVE STATEMENT: Patient has long history of bilateral ankle sprains.  On/10/2023 she underwent a left Brostrm repair as well as a left peroneal tendon repair.  She came out of the boot last week.  Since she is came out of the boot she has had mild pain in her lateral ankle and her Achilles area.  She also has a history of rolling her ankle on the right side as well.  She is back to work.  She has noticed mild swelling when she is up on it for period of time.  PERTINENT HISTORY: Headaches, glaucoma  PAIN:  Are you having pain? Yes: NPRS scale: 3/10  Pain location: left ankle and into the heel  Pain description: aching  Aggravating factors: mornings  Relieving factors: stretch the calf   PRECAUTIONS: None  RED FLAGS: None   WEIGHT BEARING RESTRICTIONS: No  FALLS:  Has patient fallen in last 6 months? No  LIVING ENVIRONMENT: Steps in her house  OCCUPATION:  NP at [employer]   Hobbies: Weightlifting    PLOF: Independent  PATIENT GOALS:  Get back to normal   NEXT MD VISIT:  July 1st   OBJECTIVE:  Note: Objective measures  were completed at Evaluation unless otherwise noted.  DIAGNOSTIC FINDINGS:  Nothing post op  PATIENT SURVEYS:  LEFS    COGNITION: Overall cognitive status: Within functional limits for tasks assessed     SENSATION: WFL  EDEMA:  Mild edema per visual inspection  MUSCLE LENGTH:  POSTURE: No Significant postural limitations  PALPATION: Mild tenderness to palpation in the scar area   LOWER EXTREMITY ROM:  Passive ROM Right eval Left eval  Hip flexion    Hip extension    Hip abduction    Hip adduction    Hip internal rotation    Hip external rotation    Knee flexion    Knee extension     Ankle dorsiflexion  0  Ankle plantarflexion  20 with mild pain   Ankle inversion    Ankle eversion     (Blank rows = not tested)  LOWER EXTREMITY MMT:  MMT Right eval Left eval  Hip flexion 46.9 37.0  Hip extension    Hip abduction 52.3 27.3  Hip adduction    Hip internal rotation    Hip external rotation    Knee flexion    Knee extension 47.4 39.5  Ankle dorsiflexion    Ankle plantarflexion    Ankle inversion    Ankle eversion     (Blank rows = not tested)   GAIT: Ambulating with brace with inimal antalgic gait.  LEFS 60/80                                                                                                                              TREATMENT DATE:  Manual: Grade I and II  anterior drawer glide  Trigger point release to calf  TFM to Achilles  DF stretch with distraction    PATIENT EDUCATION:  Education details: HEP , symptom management  Person educated: Patient Education method: Explanation, Demonstration, Tactile cues, Verbal cues, and Handouts Education comprehension: verbalized understanding, returned demonstration, verbal cues required, tactile cues required, and needs further education  HOME EXERCISE PROGRAM:   ASSESSMENT:  CLINICAL IMPRESSION: Patient is a 33 year old female status post 6 weeks Brostrm repair as well as a peroneal tendon repair in her left ankle.  She presents with expected limitations in range of motion, strength, and general functional mobility.  At this time her dorsiflexion is limited.  She would benefit from skilled therapy to improve range of motion and return to functional activities as well as returning to gym activities. OBJECTIVE IMPAIRMENTS: Abnormal gait, decreased activity tolerance, difficulty walking, decreased ROM, decreased strength, and pain.   ACTIVITY LIMITATIONS: carrying, lifting, standing, squatting, stairs, and locomotion level  PARTICIPATION LIMITATIONS: meal prep, cleaning, laundry, driving,  community activity, occupation, and yard work  PERSONAL FACTORS:  None   REHAB POTENTIAL: Excellent  CLINICAL DECISION MAKING: Stable/uncomplicated  EVALUATION COMPLEXITY: Low   GOALS: Goals reviewed with patient? Yes  SHORT TERM GOALS: Target date: 03/05/2024   Patient will increase active dorsiflexion to  5 degrees Baseline: Goal status: INITIAL  2.  Patient will demonstrate full pain-free ankle inversion and eversion Baseline:  Goal status: INITIAL  3.  Patient be independent with basic HEP Baseline:  Goal status: INITIAL   LONG TERM GOALS: Target date: 04/02/2024    Patient will go up/down 8 steps with reciprocal gait pattern  Baseline:  Goal status: INITIAL  2.  Patient will return to lifting and classes at the gym  Baseline:  Goal status: INITIAL  3.  Patient will ambulate community distances  Baseline:  Goal status: INITIAL    PLAN:  PT FREQUENCY: 2x/week  PT DURATION: 8 weeks  PLANNED INTERVENTIONS: 97110-Therapeutic exercises, 97530- Therapeutic activity, [id number]- Neuromuscular re-education, 97535- Self Care, 16109- Manual therapy, [id number]- Gait training, [id number]- Aquatic Therapy, [id number]- Electrical stimulation (unattended), 97035- Ultrasound, Patient/Family education, Stair training, Taping, Dry Needling, DME instructions, Cryotherapy, and Moist heat   PLAN FOR NEXT SESSION:  Work on DF ROM; progress weight bearing activity as tolerated. No prtocol given by MD. Use our Bronstrom protocol.    Kitty Perkins, PT 02/06/2024, 8:35 AM

## 2024-02-06 ENCOUNTER — Encounter (HOSPITAL_BASED_OUTPATIENT_CLINIC_OR_DEPARTMENT_OTHER): Payer: Self-pay | Admitting: Physical Therapy

## 2024-02-06 ENCOUNTER — Ambulatory Visit (HOSPITAL_BASED_OUTPATIENT_CLINIC_OR_DEPARTMENT_OTHER): Admitting: Physical Therapy

## 2024-02-06 DIAGNOSIS — Z0000 Encounter for general adult medical examination without abnormal findings: Secondary | ICD-10-CM | POA: Diagnosis not present

## 2024-02-06 DIAGNOSIS — D649 Anemia, unspecified: Secondary | ICD-10-CM | POA: Diagnosis not present

## 2024-02-06 DIAGNOSIS — E559 Vitamin D deficiency, unspecified: Secondary | ICD-10-CM | POA: Diagnosis not present

## 2024-02-07 ENCOUNTER — Encounter (HOSPITAL_BASED_OUTPATIENT_CLINIC_OR_DEPARTMENT_OTHER): Payer: Self-pay | Admitting: Physical Therapy

## 2024-02-07 ENCOUNTER — Ambulatory Visit (HOSPITAL_BASED_OUTPATIENT_CLINIC_OR_DEPARTMENT_OTHER): Admitting: Physical Therapy

## 2024-02-07 DIAGNOSIS — M25572 Pain in left ankle and joints of left foot: Secondary | ICD-10-CM

## 2024-02-07 DIAGNOSIS — M25672 Stiffness of left ankle, not elsewhere classified: Secondary | ICD-10-CM

## 2024-02-07 DIAGNOSIS — R2689 Other abnormalities of gait and mobility: Secondary | ICD-10-CM

## 2024-02-07 NOTE — Therapy (Signed)
 OUTPATIENT PHYSICAL THERAPY LOWER EXTREMITY EVALUATION   Patient Name: Karen Lang MRN: [id number] DOB:30-Jan-1991, 33 y.o., female Today's Date: 02/07/2024  END OF SESSION:  PT End of Session - 02/07/24 1227     Visit Number 2    Number of Visits 16    Date for PT Re-Evaluation 04/02/24    PT Start Time 1146    PT Stop Time 1230    PT Time Calculation (min) 44 min    Activity Tolerance Patient tolerated treatment well    Behavior During Therapy WFL for tasks assessed/performed              Past Medical History:  Diagnosis Date   Acne    Allergic rhinitis    Closed fracture of nasal bone    Deviated nasal septum    traumatic   Glaucoma suspect    Glaucoma suspect of both eyes    Headache    PCOS (polycystic ovarian syndrome) 07/27/2017   Past Surgical History:  Procedure Laterality Date   ARTHROSCOPY, ANKLE WITH DEBRIDEMENT Left 12/19/2023   Procedure: ARTHROSCOPY, ANKLE WITH DEBRIDEMENT;  Surgeon: Ali Ink, MD;  Location: [facility];  Service: Orthopedics;  Laterality: Left;   LIGAMENT REPAIR Left 12/19/2023   Procedure: REPAIR, LIGAMENT;  Surgeon: Ali Ink, MD;  Location: [facility];  Service: Orthopedics;  Laterality: Left;  peroneal tendon   NASAL SEPTUM SURGERY  03/27/2018   R inferior turbinate outfracture   Patient Active Problem List   Diagnosis Date Noted   Metabolic syndrome X 11/15/2020   Subclinical hypothyroidism 11/15/2020   Hirsutism 11/15/2020   PCOS (polycystic ovarian syndrome) 07/27/2017   Family history of glaucoma 06/11/2017   Glaucoma suspect of both eyes 06/11/2017   PCP NOTES >>>>>>>>>>>>>>>>>>>>> 09/14/2016   Annual physical exam 04/23/2014   Anemia 02/12/2013   Family history of thyroid  disease 02/12/2013   Allergic rhinitis 02/03/2013    PCP: Janelle Mediate DO   REFERRING PROVIDER: Ali Ink MD   REFERRING DIAG: Left Bronstrom repair and Peroneal repair  THERAPY DIAG:   Pain in left ankle and joints of left foot  Stiffness of left ankle, not elsewhere classified  Other abnormalities of gait and mobility  Rationale for Evaluation and Treatment: Rehabilitation  ONSET DATE: 12/19/2023  SUBJECTIVE:   SUBJECTIVE STATEMENT:  Pt states the ankle is stiff first thing in the morning with mild pain. Improves with warm up. No issues or pain from HEP.   Eval:  Patient has long history of bilateral ankle sprains.  On/10/2023 she underwent a left Brostrm repair as well as a left peroneal tendon repair.  She came out of the boot last week.  Since she is came out of the boot she has had mild pain in her lateral ankle and her Achilles area.  She also has a history of rolling her ankle on the right side as well.  She is back to work.  She has noticed mild swelling when she is up on it for period of time.  PERTINENT HISTORY: Headaches, glaucoma  PAIN:  Are you having pain? Yes: NPRS scale: 3/10  Pain location: left ankle and into the heel  Pain description: aching  Aggravating factors: mornings  Relieving factors: stretch the calf   PRECAUTIONS: None  RED FLAGS: None   WEIGHT BEARING RESTRICTIONS: No  FALLS:  Has patient fallen in last 6 months? 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  DIAGNOSTIC FINDINGS:  Nothing post op  PATIENT SURVEYS:  LEFS    COGNITION: Overall cognitive status: Within functional limits for tasks assessed     SENSATION: WFL  EDEMA:  Mild edema per visual inspection  MUSCLE LENGTH:  POSTURE: No Significant postural limitations  PALPATION: Mild tenderness to palpation in the scar area   LOWER EXTREMITY ROM:  Passive ROM Right eval Left eval  Hip flexion    Hip  extension    Hip abduction    Hip adduction    Hip internal rotation    Hip external rotation    Knee flexion    Knee extension    Ankle dorsiflexion  0  Ankle plantarflexion  20 with mild pain   Ankle inversion    Ankle eversion     (Blank rows = not tested)  LOWER EXTREMITY MMT:  MMT Right eval Left eval  Hip flexion 46.9 37.0  Hip extension    Hip abduction 52.3 27.3  Hip adduction    Hip internal rotation    Hip external rotation    Knee flexion    Knee extension 47.4 39.5  Ankle dorsiflexion    Ankle plantarflexion    Ankle inversion    Ankle eversion     (Blank rows = not tested)   GAIT: Ambulating with brace with inimal antalgic gait.  LEFS 60/80                                                                                                                              TREATMENT DATE:   5/22  TCJ posterior glide grade III  Exercises - Gastroc Stretch on Wall  - 1-2 x daily - 7 x weekly - 1 sets - 3 reps - 30 hold - Ankle Inversion Eversion Towel Slide  - 1-2 x daily - 7 x weekly - 3 sets - 10 reps - Heel toe Rocking  - 1-2 x daily - 7 x weekly - 2 sets - 10 reps - Standing Ankle Dorsiflexion Stretch on Chair  - 1-2 x daily - 7 x weekly - 1 sets - 1 reps - 2-5 mins hold - Sit to Stand with Resistance Around Legs  - 1-2 x daily - 7 x weekly - 3 sets - 8-10 reps - Single Leg Bridge  - 1-2 x daily - 7 x weekly - 3 sets - 8-10 reps - Heel Raise/Toe Raise  - 1-2 x daily - 7 x weekly - 3 sets - 10 reps  Eval:  Manual: Grade I and II  anterior drawer glide  Trigger point release to calf  TFM to Achilles  DF stretch with distraction    PATIENT EDUCATION:  Education details: HEP , symptom management  Person educated: Patient Education method: Explanation, Demonstration, Tactile cues, Verbal cues, and Handouts Education comprehension: verbalized understanding, returned demonstration, verbal cues required, tactile cues required, and needs further  education  HOME EXERCISE PROGRAM:  Access Code: [email or web address] URL: [email or web address] Date: 02/07/2024 Prepared by: Signa Drier   ASSESSMENT:  CLINICAL IMPRESSION: Pt able to improve AROM ankle DF to just beyond 0 deg following joint mobilizations. Antalgic gait reduced by end of session with improvement in CKC DF. Pt able to tolerate standing exercise progression without increase in pain. Pt with generalized L LE weakness as expected given post op status. Plan to continue with ROM focus and progressive CKC strength exercise as per protocol. She would benefit from skilled therapy to improve range of motion and return to functional activities as well as returning to gym activities.  OBJECTIVE IMPAIRMENTS: Abnormal gait, decreased activity tolerance, difficulty walking, decreased ROM, decreased strength, and pain.   ACTIVITY LIMITATIONS: carrying, lifting, standing, squatting, stairs, and locomotion level  PARTICIPATION LIMITATIONS: meal prep, cleaning, laundry, driving, community activity, occupation, and yard work  PERSONAL FACTORS:  None   REHAB POTENTIAL: Excellent  CLINICAL DECISION MAKING: Stable/uncomplicated  EVALUATION COMPLEXITY: Low   GOALS: Goals reviewed with patient? Yes  SHORT TERM GOALS: Target date: 03/05/2024   Patient will increase active dorsiflexion to 5 degrees Baseline: Goal status: INITIAL  2.  Patient will demonstrate full pain-free ankle inversion and eversion Baseline:  Goal status: INITIAL  3.  Patient be independent with basic HEP Baseline:  Goal status: INITIAL   LONG TERM GOALS: Target date: 04/02/2024    Patient will go up/down 8 steps with reciprocal gait pattern  Baseline:  Goal status: INITIAL  2.  Patient will return to lifting and classes at the gym  Baseline:  Goal status: INITIAL  3.  Patient will ambulate community distances  Baseline:  Goal status: INITIAL    PLAN:  PT FREQUENCY: 2x/week  PT  DURATION: 8 weeks  PLANNED INTERVENTIONS: 97110-Therapeutic exercises, 97530- Therapeutic activity, [id number]- Neuromuscular re-education, 97535- Self Care, [id number]- Manual therapy, [id number]- Gait training, [phone]- Aquatic Therapy, 97014- Electrical stimulation (unattended), 97035- Ultrasound, Patient/Family education, Stair training, Taping, Dry Needling, DME instructions, Cryotherapy, and Moist heat   PLAN FOR NEXT SESSION:  Work on DF ROM; progress weight bearing activity as tolerated. No prtocol given by MD. Use our Bronstrom protocol.    Silver Dross, PT 02/07/2024, 12:42 PM

## 2024-02-12 ENCOUNTER — Encounter (HOSPITAL_BASED_OUTPATIENT_CLINIC_OR_DEPARTMENT_OTHER): Payer: Self-pay | Admitting: Physical Therapy

## 2024-02-12 ENCOUNTER — Ambulatory Visit (HOSPITAL_BASED_OUTPATIENT_CLINIC_OR_DEPARTMENT_OTHER): Admitting: Physical Therapy

## 2024-02-12 DIAGNOSIS — R2689 Other abnormalities of gait and mobility: Secondary | ICD-10-CM | POA: Diagnosis not present

## 2024-02-12 DIAGNOSIS — M25672 Stiffness of left ankle, not elsewhere classified: Secondary | ICD-10-CM | POA: Diagnosis not present

## 2024-02-12 DIAGNOSIS — M25572 Pain in left ankle and joints of left foot: Secondary | ICD-10-CM | POA: Diagnosis not present

## 2024-02-12 NOTE — Therapy (Addendum)
 OUTPATIENT PHYSICAL THERAPY LOWER EXTREMITY TREATMENT   Patient Name: Karen Lang MRN: [id number] DOB:[birth year], 33 y.o., female Today's Date: 02/12/2024  END OF SESSION:  PT End of Session - 02/12/24 0723     Visit Number 3    Number of Visits 16    Date for PT Re-Evaluation 04/02/24    PT Start Time 0717    PT Stop Time 0755    PT Time Calculation (min) 38 min    Activity Tolerance Patient tolerated treatment well    Behavior During Therapy [facility] for tasks assessed/performed              Past Medical History:  Diagnosis Date   Acne    Allergic rhinitis    Closed fracture of nasal bone    Deviated nasal septum    traumatic   Glaucoma suspect    Glaucoma suspect of both eyes    Headache    PCOS (polycystic ovarian syndrome) 07/27/2017   Past Surgical History:  Procedure Laterality Date   ARTHROSCOPY, ANKLE WITH DEBRIDEMENT Left 12/19/2023   Procedure: ARTHROSCOPY, ANKLE WITH DEBRIDEMENT;  Surgeon: Ali Ink, MD;  Location: [facility];  Service: Orthopedics;  Laterality: Left;   LIGAMENT REPAIR Left 12/19/2023   Procedure: REPAIR, LIGAMENT;  Surgeon: Ali Ink, MD;  Location: [facility];  Service: Orthopedics;  Laterality: Left;  peroneal tendon   NASAL SEPTUM SURGERY  03/27/2018   R inferior turbinate outfracture   Patient Active Problem List   Diagnosis Date Noted   Metabolic syndrome X 11/15/2020   Subclinical hypothyroidism 11/15/2020   Hirsutism 11/15/2020   PCOS (polycystic ovarian syndrome) 07/27/2017   Family history of glaucoma 06/11/2017   Glaucoma suspect of both eyes 06/11/2017   PCP NOTES >>>>>>>>>>>>>>>>>>>>> 09/14/2016   Annual physical exam 04/23/2014   Anemia 02/12/2013   Family history of thyroid  disease 02/12/2013   Allergic rhinitis 02/03/2013    PCP: Janelle Mediate DO   REFERRING PROVIDER: Ali Ink MD   REFERRING DIAG: Left Bronstrom repair and Peroneal repair  THERAPY DIAG:   Pain in left ankle and joints of left foot  Stiffness of left ankle, not elsewhere classified  Other abnormalities of gait and mobility  Rationale for Evaluation and Treatment: Rehabilitation  ONSET DATE: 12/19/2023  SUBJECTIVE:   SUBJECTIVE STATEMENT:  Pt states every week, things are getting better.  Heel/toe raises initially caused soreness, but can now complete without any problems.   Eval:  Patient has long history of bilateral ankle sprains.  On/10/2023 she underwent a left Brostrm repair as well as a left peroneal tendon repair.  She came out of the boot last week.  Since she is came out of the boot she has had mild pain in her lateral ankle and her Achilles area.  She also has a history of rolling her ankle on the right side as well.  She is back to work.  She has noticed mild swelling when she is up on it for period of time.  PERTINENT HISTORY: Headaches, glaucoma  PAIN:  Are you having pain? No: NPRS scale: 0/10  Pain location:  Pain description:  Aggravating factors: mornings  Relieving factors: stretch the calf   PRECAUTIONS: None  RED FLAGS: None   WEIGHT BEARING RESTRICTIONS: No  FALLS:  Has patient fallen in last 6 months? 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  DIAGNOSTIC FINDINGS:  Nothing post op  PATIENT SURVEYS:  LEFS    COGNITION: Overall cognitive status: Within functional limits for tasks assessed     SENSATION: WFL  EDEMA:  Mild edema per visual inspection  MUSCLE LENGTH:  POSTURE: No Significant postural limitations  PALPATION: Mild tenderness to palpation in the scar area   LOWER EXTREMITY ROM:  Passive ROM Right eval Left eval Left  5/27  Hip flexion     Hip extension      Hip abduction     Hip adduction     Hip internal rotation     Hip external rotation     Knee flexion     Knee extension     Ankle dorsiflexion  0 -5 AROM/2 PROM  Ankle plantarflexion  20 with mild pain  44  Ankle inversion   36  Ankle eversion   24   (Blank rows = not tested)  LOWER EXTREMITY MMT:  MMT Right eval Left eval  Hip flexion 46.9 37.0  Hip extension    Hip abduction 52.3 27.3  Hip adduction    Hip internal rotation    Hip external rotation    Knee flexion    Knee extension 47.4 39.5  Ankle dorsiflexion    Ankle plantarflexion    Ankle inversion    Ankle eversion     (Blank rows = not tested)   GAIT: Ambulating with brace with inimal antalgic gait.  LEFS 60/80                                                                                                                              TREATMENT DATE:  [facility] Adult PT Treatment:                                                DATE: 02/12/24  Therapeutic Exercise: NuStep L5, 5 min for ROM Standing L calf stretch 30s x 2; L soleus stretch 30s x 2 At counter, Heel/toe raises x 10 (toe raises difficult) Single leg bridge x 10 each LE Inversion/eversion towel slide with 1.5# wt-> reg towel crunches  Squats to touch buttocks on table,  with red band above knees x 5 -> in slight staggered stance to increase WB into LLE x 10 L hip abdct in R sidelying x 10-> hip circles x10 CW/CCW L ankle DF stretch with foot on chair x 10s x 3 Manual Therapy: IASTM/STM to L gastroc / soleus to decrease fascial restrictions and improve ROM   5/22  TCJ posterior glide grade III  Exercises - Gastroc Stretch on Wall  - 1-2 x daily - 7 x weekly - 1  sets - 3 reps - 30 hold - Ankle Inversion Eversion Towel Slide  - 1-2 x daily - 7 x weekly - 3 sets - 10 reps - Heel toe Rocking  - 1-2 x daily - 7 x weekly - 2 sets - 10 reps - Standing Ankle Dorsiflexion Stretch on Chair  - 1-2 x daily - 7 x weekly - 1 sets - 1 reps - 2-5 mins  hold - Sit to Stand with Resistance Around Legs  - 1-2 x daily - 7 x weekly - 3 sets - 8-10 reps - Single Leg Bridge  - 1-2 x daily - 7 x weekly - 3 sets - 8-10 reps - Heel Raise/Toe Raise  - 1-2 x daily - 7 x weekly - 3 sets - 10 reps  Eval:  Manual: Grade I and II  anterior drawer glide  Trigger point release to calf  TFM to Achilles  DF stretch with distraction    PATIENT EDUCATION:  Education details: HEP , symptom management  Person educated: Patient Education method: Explanation, Demonstration, Tactile cues, Verbal cues Education comprehension: verbalized understanding, returned demonstration, verbal cues required, tactile cues required, and needs further education  HOME EXERCISE PROGRAM:  Access Code: [email or web address] URL: [email or web address] Date: 02/07/2024 Prepared by: Signa Drier 5/27- verbally added hip abdct in sidelying  ASSESSMENT:  CLINICAL IMPRESSION: Pt is 1 day shy of 8 wks s/p left Brostrm repair and left peroneal tendon repair. Overall improved L ankle ROM and decreased pain.  Pt with generalized L LE weakness as expected given post op status. Tolerated all exercises well, without increase in pain. Plan to continue with ROM focus and progressive CKC strength exercise as per protocol. She would benefit from skilled therapy to improve range of motion and return to functional activities as well as returning to gym activities.  OBJECTIVE IMPAIRMENTS: Abnormal gait, decreased activity tolerance, difficulty walking, decreased ROM, decreased strength, and pain.   ACTIVITY LIMITATIONS: carrying, lifting, standing, squatting, stairs, and locomotion level  PARTICIPATION LIMITATIONS: meal prep, cleaning, laundry, driving, community activity, occupation, and yard work  PERSONAL FACTORS:  None   REHAB POTENTIAL: Excellent  CLINICAL DECISION MAKING: Stable/uncomplicated  EVALUATION COMPLEXITY: Low   GOALS: Goals reviewed with patient? Yes  SHORT TERM  GOALS: Target date: 03/05/2024   Patient will increase active dorsiflexion to 5 degrees Baseline: see above Goal status: IN PROGRESS - 02/12/24  2.  Patient will demonstrate full pain-free ankle inversion and eversion Baseline:  Goal status: INITIAL  3.  Patient be independent with basic HEP Baseline:  Goal status: INITIAL   LONG TERM GOALS: Target date: 04/02/2024    Patient will go up/down 8 steps with reciprocal gait pattern  Baseline:  Goal status: INITIAL  2.  Patient will return to lifting and classes at the gym  Baseline:  Goal status: INITIAL  3.  Patient will ambulate community distances  Baseline:  Goal status: INITIAL    PLAN:  PT FREQUENCY: 2x/week  PT DURATION: 8 weeks  PLANNED INTERVENTIONS: 97110-Therapeutic exercises, 97530- Therapeutic activity, [id number]- Neuromuscular re-education, 97535- Self Care, [id number]- Manual therapy, [id number]- Gait training, [phone]- Aquatic Therapy, 97014- Electrical stimulation (unattended), 97035- Ultrasound, Patient/Family education, Stair training, Taping, Dry Needling, DME instructions, Cryotherapy, and Moist heat   PLAN FOR NEXT SESSION:  Work on DF ROM; progress weight bearing activity as tolerated. No prtocol given by MD. Use our Bronstrom protocol.    Almedia Jacobsen, PTA 02/12/24 8:12 AM [facility] [address]  [place], [place], [address] Phone: [phone]   Fax:  [phone]

## 2024-02-15 DIAGNOSIS — N939 Abnormal uterine and vaginal bleeding, unspecified: Secondary | ICD-10-CM | POA: Diagnosis not present

## 2024-02-19 ENCOUNTER — Encounter (HOSPITAL_BASED_OUTPATIENT_CLINIC_OR_DEPARTMENT_OTHER): Payer: Self-pay | Admitting: Physical Therapy

## 2024-02-19 ENCOUNTER — Ambulatory Visit (HOSPITAL_BASED_OUTPATIENT_CLINIC_OR_DEPARTMENT_OTHER): Payer: Self-pay | Attending: Orthopaedic Surgery | Admitting: Physical Therapy

## 2024-02-19 DIAGNOSIS — R2689 Other abnormalities of gait and mobility: Secondary | ICD-10-CM | POA: Insufficient documentation

## 2024-02-19 DIAGNOSIS — F4322 Adjustment disorder with anxiety: Secondary | ICD-10-CM | POA: Diagnosis not present

## 2024-02-19 DIAGNOSIS — M25672 Stiffness of left ankle, not elsewhere classified: Secondary | ICD-10-CM | POA: Insufficient documentation

## 2024-02-19 DIAGNOSIS — M25572 Pain in left ankle and joints of left foot: Secondary | ICD-10-CM | POA: Diagnosis not present

## 2024-02-19 NOTE — Therapy (Signed)
 OUTPATIENT PHYSICAL THERAPY LOWER EXTREMITY TREATMENT   Patient Name: Karen Lang MRN: [id number] DOB:1991/02/09, 33 y.o., female Today's Date: 02/19/2024  END OF SESSION:  PT End of Session - 02/19/24 0734     Visit Number 4    Number of Visits 16    Date for PT Re-Evaluation 04/02/24    PT Start Time 0717    PT Stop Time 0755    PT Time Calculation (min) 38 min    Activity Tolerance Patient tolerated treatment well    Behavior During Therapy [facility] for tasks assessed/performed               Past Medical History:  Diagnosis Date   Acne    Allergic rhinitis    Closed fracture of nasal bone    Deviated nasal septum    traumatic   Glaucoma suspect    Glaucoma suspect of both eyes    Headache    PCOS (polycystic ovarian syndrome) 07/27/2017   Past Surgical History:  Procedure Laterality Date   ARTHROSCOPY, ANKLE WITH DEBRIDEMENT Left 12/19/2023   Procedure: ARTHROSCOPY, ANKLE WITH DEBRIDEMENT;  Surgeon: Ali Ink, MD;  Location: [facility];  Service: Orthopedics;  Laterality: Left;   LIGAMENT REPAIR Left 12/19/2023   Procedure: REPAIR, LIGAMENT;  Surgeon: Ali Ink, MD;  Location: [facility];  Service: Orthopedics;  Laterality: Left;  peroneal tendon   NASAL SEPTUM SURGERY  03/27/2018   R inferior turbinate outfracture   Patient Active Problem List   Diagnosis Date Noted   Metabolic syndrome X 11/15/2020   Subclinical hypothyroidism 11/15/2020   Hirsutism 11/15/2020   PCOS (polycystic ovarian syndrome) 07/27/2017   Family history of glaucoma 06/11/2017   Glaucoma suspect of both eyes 06/11/2017   PCP NOTES >>>>>>>>>>>>>>>>>>>>> 09/14/2016   Annual physical exam 04/23/2014   Anemia 02/12/2013   Family history of thyroid  disease 02/12/2013   Allergic rhinitis 02/03/2013    PCP: Janelle Mediate DO   REFERRING PROVIDER: Ali Ink MD   REFERRING DIAG: Left Bronstrom repair and Peroneal repair  THERAPY DIAG:   Pain in left ankle and joints of left foot  Stiffness of left ankle, not elsewhere classified  Other abnormalities of gait and mobility  Rationale for Evaluation and Treatment: Rehabilitation  ONSET DATE: 12/19/2023  SUBJECTIVE:   SUBJECTIVE STATEMENT:  Pt states HEP exercises are getting easier, "I am completing twice a day".  Pt reports no pain and she is having less stiffness in ankle.   Eval:  Patient has long history of bilateral ankle sprains.  On/10/2023 she underwent a left Brostrm repair as well as a left peroneal tendon repair.  She came out of the boot last week.  Since she is came out of the boot she has had mild pain in her lateral ankle and her Achilles area.  She also has a history of rolling her ankle on the right side as well.  She is back to work.  She has noticed mild swelling when she is up on it for period of time.  PERTINENT HISTORY: Headaches, glaucoma  PAIN:  Are you having pain? No: NPRS scale: 0/10  Pain location:  Pain description:  Aggravating factors: mornings  Relieving factors: stretch the calf   PRECAUTIONS: None  RED FLAGS: None   WEIGHT BEARING RESTRICTIONS: No  FALLS:  Has patient fallen in last 6 months? 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  DIAGNOSTIC FINDINGS:  Nothing post op  PATIENT SURVEYS:  LEFS    COGNITION: Overall cognitive status: Within functional limits for tasks assessed     SENSATION: WFL  EDEMA:  Mild edema per visual inspection  MUSCLE LENGTH:  POSTURE: No Significant postural limitations  PALPATION: Mild tenderness to palpation in the scar area   LOWER EXTREMITY ROM:  Passive ROM Right eval Left eval Left  5/27  Hip flexion     Hip extension      Hip abduction     Hip adduction     Hip internal rotation     Hip external rotation     Knee flexion     Knee extension     Ankle dorsiflexion  0 -5 AROM/2 PROM  Ankle plantarflexion  20 with mild pain  44  Ankle inversion   36  Ankle eversion   24   (Blank rows = not tested)  LOWER EXTREMITY MMT:  MMT Right eval Left eval  Hip flexion 46.9 37.0  Hip extension    Hip abduction 52.3 27.3  Hip adduction    Hip internal rotation    Hip external rotation    Knee flexion    Knee extension 47.4 39.5  Ankle dorsiflexion    Ankle plantarflexion    Ankle inversion    Ankle eversion     (Blank rows = not tested)   GAIT: Ambulating with brace with inimal antalgic gait.  LEFS 60/80                                                                                                                              TREATMENT DATE:  [facility] Adult PT Treatment:                                                DATE: 02/19/24  Therapeutic Exercise: NuStep L5, 5 min for ROM Standing L calf stretch 30s x 2; L soleus stretch 15s x 2 PF/DF on 1/2 foam roller in standing with UE support on bike seat Heel/toe raises x 10 L SLS flat surface with horiz head motions Seated BAPS L2 -> L3  circles CW/CCW L hip abdct in R sidelying x 10, rainbow arcs x 10 R/L Resisted L ankle DF 2 x 10 with red band, inversion and eversion x 5 each (trial) Forward step down/ retro step up, 2" x 5, 4" x 10  Squats to touch buttocks on table staggered stance to increase WB into LLE x 5    [facility] Adult PT Treatment:  DATE: 02/12/24  Therapeutic Exercise: NuStep L5, 5 min for ROM Standing L calf stretch 30s x 2; L soleus stretch 30s x 2 At counter, Heel/toe raises x 10 (toe raises difficult) Single leg bridge x 10 each LE Inversion/eversion towel slide with 1.5# wt-> reg towel crunches  Squats to touch buttocks on table,  with red band above knees x 5 -> in slight staggered  stance to increase WB into LLE x 10 L hip abdct in R sidelying x 10-> hip circles x10 CW/CCW L ankle DF stretch with foot on chair x 10s x 3 Manual Therapy: IASTM/STM to L gastroc / soleus to decrease fascial restrictions and improve ROM   5/22  TCJ posterior glide grade III  Exercises - Gastroc Stretch on Wall  - 1-2 x daily - 7 x weekly - 1 sets - 3 reps - 30 hold - Ankle Inversion Eversion Towel Slide  - 1-2 x daily - 7 x weekly - 3 sets - 10 reps - Heel toe Rocking  - 1-2 x daily - 7 x weekly - 2 sets - 10 reps - Standing Ankle Dorsiflexion Stretch on Chair  - 1-2 x daily - 7 x weekly - 1 sets - 1 reps - 2-5 mins hold - Sit to Stand with Resistance Around Legs  - 1-2 x daily - 7 x weekly - 3 sets - 8-10 reps - Single Leg Bridge  - 1-2 x daily - 7 x weekly - 3 sets - 8-10 reps - Heel Raise/Toe Raise  - 1-2 x daily - 7 x weekly - 3 sets - 10 reps  Eval:  Manual: Grade I and II  anterior drawer glide  Trigger point release to calf  TFM to Achilles  DF stretch with distraction    PATIENT EDUCATION:  Education details: HEP , symptom management  Person educated: Patient Education method: Explanation, Demonstration, Tactile cues, Verbal cues Education comprehension: verbalized understanding, returned demonstration, verbal cues required, tactile cues required, and needs further education  HOME EXERCISE PROGRAM:  Access Code: [email or web address] URL: [email or web address] Date: 02/07/2024 Prepared by: Signa Drier 5/27- verbally added hip abdct in sidelying  ASSESSMENT:  CLINICAL IMPRESSION: Pt is 1 day shy of 9 wks s/p left Brostrm repair and left peroneal tendon repair. Pt tolerated all exercises well, without increase in pain or symptoms. Plan to continue with ROM focus and progressive CKC strength exercise as per protocol. Updated HEP and issued red band. Step downs were more difficult from a knee standpoint than at ankle.  She would benefit from skilled therapy to  improve range of motion and return to functional activities as well as returning to gym activities.  OBJECTIVE IMPAIRMENTS: Abnormal gait, decreased activity tolerance, difficulty walking, decreased ROM, decreased strength, and pain.   ACTIVITY LIMITATIONS: carrying, lifting, standing, squatting, stairs, and locomotion level  PARTICIPATION LIMITATIONS: meal prep, cleaning, laundry, driving, community activity, occupation, and yard work  PERSONAL FACTORS:  None   REHAB POTENTIAL: Excellent  CLINICAL DECISION MAKING: Stable/uncomplicated  EVALUATION COMPLEXITY: Low   GOALS: Goals reviewed with patient? Yes  SHORT TERM GOALS: Target date: 03/05/2024   Patient will increase active dorsiflexion to 5 degrees Baseline: see above Goal status: IN PROGRESS - 02/12/24  2.  Patient will demonstrate full pain-free ankle inversion and eversion Baseline:  Goal status: INITIAL  3.  Patient be independent with basic HEP Baseline:  Goal status: INITIAL   LONG TERM GOALS: Target date: 04/02/2024    Patient will go up/down 8 steps  with reciprocal gait pattern  Baseline:  Goal status: INITIAL  2.  Patient will return to lifting and classes at the gym  Baseline:  Goal status: INITIAL  3.  Patient will ambulate community distances  Baseline:  Goal status: INITIAL    PLAN:  PT FREQUENCY: 2x/week  PT DURATION: 8 weeks  PLANNED INTERVENTIONS: 97110-Therapeutic exercises, 97530- Therapeutic activity, [id number]- Neuromuscular re-education, 97535- Self Care, 21308- Manual therapy, [id number]- Gait training, [phone]- Aquatic Therapy, 97014- Electrical stimulation (unattended), 97035- Ultrasound, Patient/Family education, Stair training, Taping, Dry Needling, DME instructions, Cryotherapy, and Moist heat   PLAN FOR NEXT SESSION:  Work on DF ROM; progress weight bearing activity as tolerated. No protocol given by MD. Use our Bronstrom protocol.    Almedia Jacobsen, PTA 02/19/24 1:31  PM [facility] [address] [place], [place], [address] Phone: [phone]   Fax:  [phone]

## 2024-02-27 ENCOUNTER — Other Ambulatory Visit (HOSPITAL_BASED_OUTPATIENT_CLINIC_OR_DEPARTMENT_OTHER): Payer: Self-pay

## 2024-02-27 ENCOUNTER — Encounter (HOSPITAL_BASED_OUTPATIENT_CLINIC_OR_DEPARTMENT_OTHER): Admitting: Physical Therapy

## 2024-02-27 MED ORDER — NYSTATIN-TRIAMCINOLONE 100000-0.1 UNIT/GM-% EX CREA
1.0000 | TOPICAL_CREAM | Freq: Two times a day (BID) | CUTANEOUS | 3 refills | Status: AC
  Filled 2024-02-27: qty 15, 8d supply, fill #0

## 2024-02-29 ENCOUNTER — Encounter (HOSPITAL_BASED_OUTPATIENT_CLINIC_OR_DEPARTMENT_OTHER): Admitting: Physical Therapy

## 2024-03-18 ENCOUNTER — Encounter (HOSPITAL_BASED_OUTPATIENT_CLINIC_OR_DEPARTMENT_OTHER): Admitting: Physical Therapy

## 2024-03-18 ENCOUNTER — Other Ambulatory Visit (HOSPITAL_BASED_OUTPATIENT_CLINIC_OR_DEPARTMENT_OTHER): Payer: Self-pay

## 2024-03-18 MED ORDER — DICLOFENAC SODIUM 1 % EX GEL
2.0000 g | Freq: Four times a day (QID) | CUTANEOUS | 1 refills | Status: DC
  Filled 2024-03-18: qty 100, 25d supply, fill #0

## 2024-03-18 MED ORDER — MELOXICAM 7.5 MG PO TABS
ORAL_TABLET | ORAL | 0 refills | Status: AC
  Filled 2024-03-18: qty 60, 44d supply, fill #0

## 2024-03-19 DIAGNOSIS — F4322 Adjustment disorder with anxiety: Secondary | ICD-10-CM | POA: Diagnosis not present

## 2024-03-20 ENCOUNTER — Encounter (HOSPITAL_BASED_OUTPATIENT_CLINIC_OR_DEPARTMENT_OTHER): Admitting: Physical Therapy

## 2024-03-24 DIAGNOSIS — F4322 Adjustment disorder with anxiety: Secondary | ICD-10-CM | POA: Diagnosis not present

## 2024-03-25 ENCOUNTER — Encounter (HOSPITAL_BASED_OUTPATIENT_CLINIC_OR_DEPARTMENT_OTHER): Payer: Self-pay | Admitting: Physical Therapy

## 2024-03-25 ENCOUNTER — Ambulatory Visit (HOSPITAL_BASED_OUTPATIENT_CLINIC_OR_DEPARTMENT_OTHER): Attending: Orthopaedic Surgery | Admitting: Physical Therapy

## 2024-03-25 DIAGNOSIS — M25672 Stiffness of left ankle, not elsewhere classified: Secondary | ICD-10-CM | POA: Diagnosis not present

## 2024-03-25 DIAGNOSIS — M25572 Pain in left ankle and joints of left foot: Secondary | ICD-10-CM | POA: Insufficient documentation

## 2024-03-25 DIAGNOSIS — R2689 Other abnormalities of gait and mobility: Secondary | ICD-10-CM | POA: Diagnosis not present

## 2024-03-25 NOTE — Therapy (Signed)
 OUTPATIENT PHYSICAL THERAPY LOWER EXTREMITY TREATMENT   Patient Name: Karen Lang MRN: [id number] DOB:[birth year], 33 y.o., female Today's Date: 03/26/2024  END OF SESSION:  PT End of Session - 03/25/24 1642     Visit Number 5    Number of Visits 16    Date for PT Re-Evaluation 04/02/24    Authorization Type Jolynn Davene LEYDEN    PT Start Time 1636    PT Stop Time 1718    PT Time Calculation (min) 42 min    Activity Tolerance Patient tolerated treatment well    Behavior During Therapy WFL for tasks assessed/performed             Past Medical History:  Diagnosis Date   Acne    Allergic rhinitis    Closed fracture of nasal bone    Deviated nasal septum    traumatic   Glaucoma suspect    Glaucoma suspect of both eyes    Headache    PCOS (polycystic ovarian syndrome) 07/27/2017   Past Surgical History:  Procedure Laterality Date   ARTHROSCOPY, ANKLE WITH DEBRIDEMENT Left 12/19/2023   Procedure: ARTHROSCOPY, ANKLE WITH DEBRIDEMENT;  Surgeon: Barton Drape, MD;  Location: [facility];  Service: Orthopedics;  Laterality: Left;   LIGAMENT REPAIR Left 12/19/2023   Procedure: REPAIR, LIGAMENT;  Surgeon: Barton Drape, MD;  Location: [facility];  Service: Orthopedics;  Laterality: Left;  peroneal tendon   NASAL SEPTUM SURGERY  03/27/2018   R inferior turbinate outfracture   Patient Active Problem List   Diagnosis Date Noted   Metabolic syndrome X 11/15/2020   Subclinical hypothyroidism 11/15/2020   Hirsutism 11/15/2020   PCOS (polycystic ovarian syndrome) 07/27/2017   Family history of glaucoma 06/11/2017   Glaucoma suspect of both eyes 06/11/2017   PCP NOTES >>>>>>>>>>>>>>>>>>>>> 09/14/2016   Annual physical exam 04/23/2014   Anemia 02/12/2013   Family history of thyroid  disease 02/12/2013   Allergic rhinitis 02/03/2013    PCP: mathew Bouchard DO   REFERRING PROVIDER: Drape Barton MD   REFERRING DIAG: Left Bronstrom repair  and Peroneal repair  THERAPY DIAG:  Pain in left ankle and joints of left foot  Stiffness of left ankle, not elsewhere classified  Other abnormalities of gait and mobility  Rationale for Evaluation and Treatment: Rehabilitation  ONSET DATE: 12/19/2023  SUBJECTIVE:   SUBJECTIVE STATEMENT:  Pt is 13 weeks and 6 days s/p L ankle brostrom repair and peroneus brevis tendon repair and peroneus longus tendon debridement and tenosynovectomy.  Pt reports no increased pain after prior Rx, just a little swelling.  Pt used ice after prior Rx.  Pt has been on vacation.  Pt reports compliance with HEP.  Pt is having stiffness in ankle and tightness in plantar fascia in the AM.    Pt saw surgeon on 7/1 and he states everything looks good.  He wants pt to continue with PT.   He thinks she is having plantar fascia pain.  MD gave pt some exercises including calf stretching at wall and on step, eve/inv with towel, and soft tissue mobilization with lacrosse ball. Pt bought some new inserts.    PERTINENT HISTORY: Headaches, glaucoma  PAIN:  Are you having pain? No: NPRS scale: 0/10  Pain location:  L ankle Pain description:  Aggravating factors: mornings  Relieving factors: stretch the calf   PRECAUTIONS: None  RED FLAGS: None   WEIGHT BEARING RESTRICTIONS: No  FALLS:  Has patient fallen in last 6 months? 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  DIAGNOSTIC FINDINGS:  Nothing post op  PATIENT SURVEYS:  LEFS    COGNITION: Overall cognitive status: Within functional limits for tasks assessed     SENSATION: WFL  EDEMA:  Mild edema per visual inspection  MUSCLE LENGTH:  POSTURE: No Significant postural limitations  PALPATION: Mild  tenderness to palpation in the scar area   LOWER EXTREMITY ROM:  Passive ROM Right eval Left eval Left  5/27 Left 7/8  Hip flexion      Hip extension      Hip abduction      Hip adduction      Hip internal rotation      Hip external rotation      Knee flexion      Knee extension      Ankle dorsiflexion  0 -5 AROM/2 PROM 3 AROM  Ankle plantarflexion  20 with mild pain  44   Ankle inversion   36   Ankle eversion   24    (Blank rows = not tested)  LOWER EXTREMITY MMT:  MMT Right eval Left eval  Hip flexion 46.9 37.0  Hip extension    Hip abduction 52.3 27.3  Hip adduction    Hip internal rotation    Hip external rotation    Knee flexion    Knee extension 47.4 39.5  Ankle dorsiflexion    Ankle plantarflexion    Ankle inversion    Ankle eversion     (Blank rows = not tested)   GAIT: Ambulating with brace with inimal antalgic gait.  LEFS 60/80                                                                                                                              TREATMENT DATE:  03/25/24  Scifit Recumbent bike x 5 mins Standing heel/toe raises 2x10 Seated wobble board DF/PF, cw, and ccw x 20 each  Assessed L ankle DF AROM. Pt received L ankle DF PROM.  Ankle DF with RTB 2x10 Ankle ABC x 1 rep Step ups 6 inch 2x10 Step downs 4 inch 2x10   [facility] Adult PT Treatment:                                                DATE: 02/19/24  Therapeutic Exercise: NuStep L5, 5 min for ROM Standing L calf stretch 30s x 2; L soleus stretch 15s x 2 PF/DF on 1/2 foam roller in standing with UE support on bike seat Heel/toe raises x 10 L SLS flat surface  with horiz head motions Seated BAPS L2 -> L3  circles CW/CCW L hip abdct in R sidelying x 10, rainbow arcs x 10 R/L Resisted L ankle DF 2 x 10 with red band, inversion and eversion x 5 each (trial) Forward step down/ retro step up, 2 x 5, 4 x 10  Squats to touch buttocks on table staggered stance to increase WB into  LLE x 5    [facility] Adult PT Treatment:                                                DATE: 02/12/24  Therapeutic Exercise: NuStep L5, 5 min for ROM Standing L calf stretch 30s x 2; L soleus stretch 30s x 2 At counter, Heel/toe raises x 10 (toe raises difficult) Single leg bridge x 10 each LE Inversion/eversion towel slide with 1.5# wt-> reg towel crunches  Squats to touch buttocks on table,  with red band above knees x 5 -> in slight staggered stance to increase WB into LLE x 10 L hip abdct in R sidelying x 10-> hip circles x10 CW/CCW L ankle DF stretch with foot on chair x 10s x 3 Manual Therapy: IASTM/STM to L gastroc / soleus to decrease fascial restrictions and improve ROM   5/22  TCJ posterior glide grade III  Exercises - Gastroc Stretch on Wall  - 1-2 x daily - 7 x weekly - 1 sets - 3 reps - 30 hold - Ankle Inversion Eversion Towel Slide  - 1-2 x daily - 7 x weekly - 3 sets - 10 reps - Heel toe Rocking  - 1-2 x daily - 7 x weekly - 2 sets - 10 reps - Standing Ankle Dorsiflexion Stretch on Chair  - 1-2 x daily - 7 x weekly - 1 sets - 1 reps - 2-5 mins hold - Sit to Stand with Resistance Around Legs  - 1-2 x daily - 7 x weekly - 3 sets - 8-10 reps - Single Leg Bridge  - 1-2 x daily - 7 x weekly - 3 sets - 8-10 reps - Heel Raise/Toe Raise  - 1-2 x daily - 7 x weekly - 3 sets - 10 reps  Eval:  Manual: Grade I and II  anterior drawer glide  Trigger point release to calf  TFM to Achilles  DF stretch with distraction    PATIENT EDUCATION:  Education details: HEP , symptom management, exercise form Person educated: Patient Education method: Explanation, Demonstration, Tactile cues, Verbal cues Education comprehension: verbalized understanding, returned demonstration, verbal cues required, tactile cues required, and needs further education  HOME EXERCISE PROGRAM:  Access Code: [email or web address] URL: [email or web address] Date: 02/07/2024 Prepared by: Alm Don 5/27- verbally added hip abdct in sidelying  ASSESSMENT:  CLINICAL IMPRESSION: Pt is progressing well with protocol.  She performed exercises per protocol well with cuing and instruction in correct form.  She had good tolerance with exercises.  PT assessed ankle DF AROM.  Pt is still limited in ankle DF ROM though demonstrates improved L ankle AROM as evidenced by goniometric measurements.  Pt saw MD recently and he is pleased with her progress.  He thinks pt is having plantar fascial pain and gave pt exercises for plantar fascia pain.  She responded well to Rx reporting no pain after Rx.  Pt should benefit from cont skilled PT per  protocol to address ongoing goals and impairments and to assist in restoring desired level of function.  OBJECTIVE IMPAIRMENTS: Abnormal gait, decreased activity tolerance, difficulty walking, decreased ROM, decreased strength, and pain.   ACTIVITY LIMITATIONS: carrying, lifting, standing, squatting, stairs, and locomotion level  PARTICIPATION LIMITATIONS: meal prep, cleaning, laundry, driving, community activity, occupation, and yard work  PERSONAL FACTORS:  None   REHAB POTENTIAL: Excellent  CLINICAL DECISION MAKING: Stable/uncomplicated  EVALUATION COMPLEXITY: Low   GOALS: Goals reviewed with patient? Yes  SHORT TERM GOALS: Target date: 03/05/2024   Patient will increase active dorsiflexion to 5 degrees Baseline: see above Goal status: IN PROGRESS - 02/12/24  2.  Patient will demonstrate full pain-free ankle inversion and eversion Baseline:  Goal status: INITIAL  3.  Patient be independent with basic HEP Baseline:  Goal status: INITIAL   LONG TERM GOALS: Target date: 04/02/2024    Patient will go up/down 8 steps with reciprocal gait pattern  Baseline:  Goal status: INITIAL  2.  Patient will return to lifting and classes at the gym  Baseline:  Goal status: INITIAL  3.  Patient will ambulate community distances  Baseline:  Goal  status: INITIAL    PLAN:  PT FREQUENCY: 2x/week  PT DURATION: 8 weeks  PLANNED INTERVENTIONS: 97110-Therapeutic exercises, 97530- Therapeutic activity, [id number]- Neuromuscular re-education, 97535- Self Care, [id number]- Manual therapy, [id number]- Gait training, [phone]- Aquatic Therapy, 97014- Electrical stimulation (unattended), 97035- Ultrasound, Patient/Family education, Stair training, Taping, Dry Needling, DME instructions, Cryotherapy, and Moist heat   PLAN FOR NEXT SESSION:  Work on DF ROM; progress weight bearing activity as tolerated. No protocol given by MD. Use our Bronstrom protocol.    Leigh Minerva III PT, DPT 03/26/24 10:54 PM  [facility] [address] [place], [place], [address] Phone: [phone]   Fax:  [phone]

## 2024-03-26 DIAGNOSIS — M9902 Segmental and somatic dysfunction of thoracic region: Secondary | ICD-10-CM | POA: Diagnosis not present

## 2024-03-26 DIAGNOSIS — M9906 Segmental and somatic dysfunction of lower extremity: Secondary | ICD-10-CM | POA: Diagnosis not present

## 2024-03-26 DIAGNOSIS — M9905 Segmental and somatic dysfunction of pelvic region: Secondary | ICD-10-CM | POA: Diagnosis not present

## 2024-03-26 DIAGNOSIS — M9901 Segmental and somatic dysfunction of cervical region: Secondary | ICD-10-CM | POA: Diagnosis not present

## 2024-03-26 DIAGNOSIS — M9903 Segmental and somatic dysfunction of lumbar region: Secondary | ICD-10-CM | POA: Diagnosis not present

## 2024-03-27 ENCOUNTER — Ambulatory Visit (HOSPITAL_BASED_OUTPATIENT_CLINIC_OR_DEPARTMENT_OTHER): Admitting: Physical Therapy

## 2024-03-28 ENCOUNTER — Other Ambulatory Visit (HOSPITAL_BASED_OUTPATIENT_CLINIC_OR_DEPARTMENT_OTHER): Payer: Self-pay

## 2024-04-01 ENCOUNTER — Ambulatory Visit (HOSPITAL_BASED_OUTPATIENT_CLINIC_OR_DEPARTMENT_OTHER): Admitting: Physical Therapy

## 2024-04-03 ENCOUNTER — Encounter (HOSPITAL_BASED_OUTPATIENT_CLINIC_OR_DEPARTMENT_OTHER): Admitting: Physical Therapy

## 2024-04-04 ENCOUNTER — Encounter (HOSPITAL_BASED_OUTPATIENT_CLINIC_OR_DEPARTMENT_OTHER): Payer: Self-pay | Admitting: Physical Therapy

## 2024-04-04 ENCOUNTER — Ambulatory Visit (HOSPITAL_BASED_OUTPATIENT_CLINIC_OR_DEPARTMENT_OTHER): Admitting: Physical Therapy

## 2024-04-04 DIAGNOSIS — M25672 Stiffness of left ankle, not elsewhere classified: Secondary | ICD-10-CM

## 2024-04-04 DIAGNOSIS — M25572 Pain in left ankle and joints of left foot: Secondary | ICD-10-CM | POA: Diagnosis not present

## 2024-04-04 DIAGNOSIS — R2689 Other abnormalities of gait and mobility: Secondary | ICD-10-CM

## 2024-04-04 NOTE — Therapy (Signed)
 OUTPATIENT PHYSICAL THERAPY LOWER EXTREMITY TREATMENT   Patient Name: Karen Lang MRN: [id number] DOB:[birth year], 33 y.o., female Today's Date: 04/04/2024  PHYSICAL THERAPY DISCHARGE SUMMARY  Visits from Start of Care: 6  Current functional level related to goals / functional outcomes: See below   Remaining deficits: See below   Education / Equipment: See below   Patient agrees to discharge. Patient goals were met. Patient is being discharged due to being pleased with the current functional level.     END OF SESSION:  PT End of Session - 04/04/24 0715     Visit Number 6    Number of Visits 16    Date for PT Re-Evaluation 04/02/24    Authorization Type Karen Lang    PT Start Time 0715    PT Stop Time 0735    PT Time Calculation (min) 20 min    Activity Tolerance Patient tolerated treatment well    Behavior During Therapy [name] for tasks assessed/performed             Past Medical History:  Diagnosis Date   Acne    Allergic rhinitis    Closed fracture of nasal bone    Deviated nasal septum    traumatic   Glaucoma suspect    Glaucoma suspect of both eyes    Headache    PCOS (polycystic ovarian syndrome) 07/27/2017   Past Surgical History:  Procedure Laterality Date   ARTHROSCOPY, ANKLE WITH DEBRIDEMENT Left 12/19/2023   Procedure: ARTHROSCOPY, ANKLE WITH DEBRIDEMENT;  Surgeon: Karen Drape, Lang;  Location: [facility];  Service: Orthopedics;  Laterality: Left;   LIGAMENT REPAIR Left 12/19/2023   Procedure: REPAIR, LIGAMENT;  Surgeon: Karen Drape, Lang;  Location: [facility];  Service: Orthopedics;  Laterality: Left;  peroneal tendon   NASAL SEPTUM SURGERY  03/27/2018   R inferior turbinate outfracture   Patient Active Problem List   Diagnosis Date Noted   Metabolic syndrome X 11/15/2020   Subclinical hypothyroidism 11/15/2020   Hirsutism 11/15/2020   PCOS (polycystic ovarian syndrome) 07/27/2017   Family  history of glaucoma 06/11/2017   Glaucoma suspect of both eyes 06/11/2017   PCP NOTES >>>>>>>>>>>>>>>>>>>>> 09/14/2016   Annual physical exam 04/23/2014   Anemia 02/12/2013   Family history of thyroid  disease 02/12/2013   Allergic rhinitis 02/03/2013    PCP: Karen Bouchard DO   REFERRING PROVIDER: Drape Barton Lang   REFERRING DIAG: Left Bronstrom repair and Peroneal repair  THERAPY DIAG:  Pain in left ankle and joints of left foot  Stiffness of left ankle, not elsewhere classified  Other abnormalities of gait and mobility  Rationale for Evaluation and Treatment: Rehabilitation  ONSET DATE: 12/19/2023  SUBJECTIVE:   SUBJECTIVE STATEMENT:  Patient states everything is going good. Stiffness in the morning, continues to get better. Doing stretches and HEP. Doesn't feel limited with anything at this point. Not limited with walking. Back to gym. Continues to swell but not excessive. Patient states 70-75% functional status. Remains limited by stiffness in the calf in the morning which eases after a few minutes.     PERTINENT HISTORY: Headaches, glaucoma  PAIN:  Are you having pain? No: NPRS scale: 0/10  Pain location:  L ankle Pain description:  Aggravating factors: mornings  Relieving factors: stretch the calf   PRECAUTIONS: None  RED FLAGS: None   WEIGHT BEARING RESTRICTIONS: No  FALLS:  Has patient fallen in last 6 months? No  LIVING ENVIRONMENT: Steps in her house  OCCUPATION:  NP at [employer]   Hobbies: Weightlifting    PLOF: Independent  PATIENT GOALS:  Get back to normal   NEXT Lang VISIT:  July 1st   OBJECTIVE:  Note: Objective measures were completed at Evaluation unless otherwise noted.  DIAGNOSTIC FINDINGS:  Nothing post op  PATIENT SURVEYS:  LEFS    COGNITION: Overall cognitive status: Within functional limits for tasks assessed     SENSATION: WFL  EDEMA:  Mild edema per visual inspection  MUSCLE LENGTH:  POSTURE: No  Significant postural limitations  PALPATION: Mild tenderness to palpation in the scar area   LOWER EXTREMITY ROM:  Passive ROM Right eval Left eval Left  5/27 Left 7/8 Left 04/04/24  Hip flexion       Hip extension       Hip abduction       Hip adduction       Hip internal rotation       Hip external rotation       Knee flexion       Knee extension       Ankle dorsiflexion  0 -5 AROM/2 PROM 3 AROM 5  Ankle plantarflexion  20 with mild pain  44  45  Ankle inversion   36  36  Ankle eversion   24  27   (Blank rows = not tested)  LOWER EXTREMITY MMT:  MMT Right eval Left eval Right 04/04/24 Left 04/04/24  Hip flexion 46.9 37.0    Hip extension      Hip abduction 52.3 27.3    Hip adduction      Hip internal rotation      Hip external rotation      Knee flexion      Knee extension 47.4 39.5    Ankle dorsiflexion   4+ 5  Ankle plantarflexion   20 SL HR 25 SL HR  Ankle inversion   5 5  Ankle eversion   5 5   (Blank rows = not tested)   GAIT: Ambulating with brace with inimal antalgic gait.  LEFS 60/80 Reassessment 04/04/24   Stairs: alternating pattern without UE support. WFL bilaterally LEFS:  79 /80                                                                                                                        TREATMENT DATE:  04/04/24 Reassessment Discussion of findings, POC, HEP  03/25/24  Scifit Recumbent bike x 5 mins Standing heel/toe raises 2x10 Seated wobble board DF/PF, cw, and ccw x 20 each  Assessed L ankle DF AROM. Pt received L ankle DF PROM.  Ankle DF with RTB 2x10 Ankle ABC x 1 rep Step ups 6 inch 2x10 Step downs 4 inch 2x10   [facility] Adult PT Treatment:  DATE: 02/19/24  Therapeutic Exercise: NuStep L5, 5 min for ROM Standing L calf stretch 30s x 2; L soleus stretch 15s x 2 PF/DF on 1/2 foam roller in standing with UE support on bike seat Heel/toe raises x 10 L SLS flat surface with  horiz head motions Seated BAPS L2 -> L3  circles CW/CCW L hip abdct in R sidelying x 10, rainbow arcs x 10 R/L Resisted L ankle DF 2 x 10 with red band, inversion and eversion x 5 each (trial) Forward step down/ retro step up, 2 x 5, 4 x 10  Squats to touch buttocks on table staggered stance to increase WB into LLE x 5    [facility] Adult PT Treatment:                                                DATE: 02/12/24  Therapeutic Exercise: NuStep L5, 5 min for ROM Standing L calf stretch 30s x 2; L soleus stretch 30s x 2 At counter, Heel/toe raises x 10 (toe raises difficult) Single leg bridge x 10 each LE Inversion/eversion towel slide with 1.5# wt-> reg towel crunches  Squats to touch buttocks on table,  with red band above knees x 5 -> in slight staggered stance to increase WB into LLE x 10 L hip abdct in R sidelying x 10-> hip circles x10 CW/CCW L ankle DF stretch with foot on chair x 10s x 3 Manual Therapy: IASTM/STM to L gastroc / soleus to decrease fascial restrictions and improve ROM   5/22  TCJ posterior glide grade III  Exercises - Gastroc Stretch on Wall  - 1-2 x daily - 7 x weekly - 1 sets - 3 reps - 30 hold - Ankle Inversion Eversion Towel Slide  - 1-2 x daily - 7 x weekly - 3 sets - 10 reps - Heel toe Rocking  - 1-2 x daily - 7 x weekly - 2 sets - 10 reps - Standing Ankle Dorsiflexion Stretch on Chair  - 1-2 x daily - 7 x weekly - 1 sets - 1 reps - 2-5 mins hold - Sit to Stand with Resistance Around Legs  - 1-2 x daily - 7 x weekly - 3 sets - 8-10 reps - Single Leg Bridge  - 1-2 x daily - 7 x weekly - 3 sets - 8-10 reps - Heel Raise/Toe Raise  - 1-2 x daily - 7 x weekly - 3 sets - 10 reps  Eval:  Manual: Grade I and II  anterior drawer glide  Trigger point release to calf  TFM to Achilles  DF stretch with distraction    PATIENT EDUCATION:  Education details: HEP , symptom management, exercise form 04/04/24: reassessment findings, HEP, returning to PT if needed Person  educated: Patient Education method: Explanation, Demonstration, Tactile cues, Verbal cues Education comprehension: verbalized understanding, returned demonstration, verbal cues required, tactile cues required, and needs further education  HOME EXERCISE PROGRAM:  Access Code: [email or web address] URL: [email or web address] Date: 02/07/2024 Prepared by: Alm Don 5/27- verbally added hip abdct in sidelying  ASSESSMENT:  CLINICAL IMPRESSION: Patient has met 3/3 short term goals and 3/3 long term goals with ability to complete HEP and improvement in symptoms, strength, ROM, activity tolerance, gait, balance, and functional mobility. Patient is lacking some end range DF but does have 5 degrees and does not compensate functionally. Patient  is without limitation with ADL and has returned to gym/full activity without issue. Overall, patient is doing great. Discussed reassessment findings, continued HEP, and returning to PT if needed. Patient discharged from PT.   OBJECTIVE IMPAIRMENTS: Abnormal gait, decreased activity tolerance, difficulty walking, decreased ROM, decreased strength, and pain.   ACTIVITY LIMITATIONS: carrying, lifting, standing, squatting, stairs, and locomotion level  PARTICIPATION LIMITATIONS: meal prep, cleaning, laundry, driving, community activity, occupation, and yard work  PERSONAL FACTORS:  None   REHAB POTENTIAL: Excellent  CLINICAL DECISION MAKING: Stable/uncomplicated  EVALUATION COMPLEXITY: Low   GOALS: Goals reviewed with patient? Yes  SHORT TERM GOALS: Target date: 03/05/2024   Patient will increase active dorsiflexion to 5 degrees Baseline: see above Goal status: MET  2.  Patient will demonstrate full pain-free ankle inversion and eversion Baseline:  Goal status: MET  3.  Patient be independent with basic HEP Baseline:  Goal status: MET   LONG TERM GOALS: Target date: 04/02/2024    Patient will go up/down 8 steps with reciprocal gait  pattern  Baseline:  Goal status: MET  2.  Patient will return to lifting and classes at the gym  Baseline:  Goal status: MET  3.  Patient will ambulate community distances  Baseline:  Goal status: MET    PLAN:  PT FREQUENCY: 2x/week  PT DURATION: 8 weeks  PLANNED INTERVENTIONS: 97110-Therapeutic exercises, 97530- Therapeutic activity, 97112- Neuromuscular re-education, 97535- Self Care, 02859- Manual therapy, [phone]- Gait training, [phone]- Aquatic Therapy, 97014- Electrical stimulation (unattended), 97035- Ultrasound, Patient/Family education, Stair training, Taping, Dry Needling, DME instructions, Cryotherapy, and Moist heat   PLAN FOR NEXT SESSION:  N/a   Prentice GORMAN Stains, PT, DPT 04/04/2024, 7:40 AM   [facility] [address] [place], [place], [address] Phone: [phone]   Fax:  [phone]

## 2024-04-09 DIAGNOSIS — F4322 Adjustment disorder with anxiety: Secondary | ICD-10-CM | POA: Diagnosis not present

## 2024-04-11 ENCOUNTER — Other Ambulatory Visit (HOSPITAL_BASED_OUTPATIENT_CLINIC_OR_DEPARTMENT_OTHER): Payer: Self-pay

## 2024-04-11 DIAGNOSIS — N939 Abnormal uterine and vaginal bleeding, unspecified: Secondary | ICD-10-CM | POA: Diagnosis not present

## 2024-04-11 MED ORDER — ESTRADIOL 0.5 MG PO TABS
0.5000 mg | ORAL_TABLET | Freq: Every day | ORAL | 1 refills | Status: DC
  Filled 2024-04-11: qty 30, 30d supply, fill #0

## 2024-04-12 ENCOUNTER — Other Ambulatory Visit (HOSPITAL_BASED_OUTPATIENT_CLINIC_OR_DEPARTMENT_OTHER): Payer: Self-pay

## 2024-04-17 ENCOUNTER — Other Ambulatory Visit (HOSPITAL_BASED_OUTPATIENT_CLINIC_OR_DEPARTMENT_OTHER): Payer: Self-pay

## 2024-04-18 DIAGNOSIS — N62 Hypertrophy of breast: Secondary | ICD-10-CM | POA: Diagnosis not present

## 2024-04-18 DIAGNOSIS — M25512 Pain in left shoulder: Secondary | ICD-10-CM | POA: Diagnosis not present

## 2024-04-18 DIAGNOSIS — M25511 Pain in right shoulder: Secondary | ICD-10-CM | POA: Diagnosis not present

## 2024-04-18 DIAGNOSIS — G8929 Other chronic pain: Secondary | ICD-10-CM | POA: Diagnosis not present

## 2024-04-18 DIAGNOSIS — M546 Pain in thoracic spine: Secondary | ICD-10-CM | POA: Diagnosis not present

## 2024-04-18 DIAGNOSIS — M542 Cervicalgia: Secondary | ICD-10-CM | POA: Diagnosis not present

## 2024-04-18 DIAGNOSIS — L304 Erythema intertrigo: Secondary | ICD-10-CM | POA: Diagnosis not present

## 2024-04-21 DIAGNOSIS — F4322 Adjustment disorder with anxiety: Secondary | ICD-10-CM | POA: Diagnosis not present

## 2024-05-06 ENCOUNTER — Telehealth: Admitting: Physician Assistant

## 2024-05-06 DIAGNOSIS — J020 Streptococcal pharyngitis: Secondary | ICD-10-CM | POA: Diagnosis not present

## 2024-05-07 ENCOUNTER — Other Ambulatory Visit (HOSPITAL_BASED_OUTPATIENT_CLINIC_OR_DEPARTMENT_OTHER): Payer: Self-pay

## 2024-05-07 DIAGNOSIS — M9903 Segmental and somatic dysfunction of lumbar region: Secondary | ICD-10-CM | POA: Diagnosis not present

## 2024-05-07 DIAGNOSIS — M9906 Segmental and somatic dysfunction of lower extremity: Secondary | ICD-10-CM | POA: Diagnosis not present

## 2024-05-07 DIAGNOSIS — M9902 Segmental and somatic dysfunction of thoracic region: Secondary | ICD-10-CM | POA: Diagnosis not present

## 2024-05-07 DIAGNOSIS — M9905 Segmental and somatic dysfunction of pelvic region: Secondary | ICD-10-CM | POA: Diagnosis not present

## 2024-05-07 DIAGNOSIS — M9901 Segmental and somatic dysfunction of cervical region: Secondary | ICD-10-CM | POA: Diagnosis not present

## 2024-05-07 MED ORDER — AMOXICILLIN 500 MG PO CAPS
500.0000 mg | ORAL_CAPSULE | Freq: Two times a day (BID) | ORAL | 0 refills | Status: AC
  Filled 2024-05-07: qty 20, 10d supply, fill #0

## 2024-05-07 NOTE — Progress Notes (Signed)

## 2024-05-15 DIAGNOSIS — F4322 Adjustment disorder with anxiety: Secondary | ICD-10-CM | POA: Diagnosis not present

## 2024-05-26 DIAGNOSIS — F4322 Adjustment disorder with anxiety: Secondary | ICD-10-CM | POA: Diagnosis not present

## 2024-06-04 DIAGNOSIS — M9903 Segmental and somatic dysfunction of lumbar region: Secondary | ICD-10-CM | POA: Diagnosis not present

## 2024-06-04 DIAGNOSIS — M9906 Segmental and somatic dysfunction of lower extremity: Secondary | ICD-10-CM | POA: Diagnosis not present

## 2024-06-04 DIAGNOSIS — M9905 Segmental and somatic dysfunction of pelvic region: Secondary | ICD-10-CM | POA: Diagnosis not present

## 2024-06-04 DIAGNOSIS — M9902 Segmental and somatic dysfunction of thoracic region: Secondary | ICD-10-CM | POA: Diagnosis not present

## 2024-06-04 DIAGNOSIS — M9901 Segmental and somatic dysfunction of cervical region: Secondary | ICD-10-CM | POA: Diagnosis not present

## 2024-06-06 DIAGNOSIS — M722 Plantar fascial fibromatosis: Secondary | ICD-10-CM | POA: Diagnosis not present

## 2024-06-06 DIAGNOSIS — S93492A Sprain of other ligament of left ankle, initial encounter: Secondary | ICD-10-CM | POA: Diagnosis not present

## 2024-06-06 DIAGNOSIS — S93491A Sprain of other ligament of right ankle, initial encounter: Secondary | ICD-10-CM | POA: Diagnosis not present

## 2024-06-09 DIAGNOSIS — F4322 Adjustment disorder with anxiety: Secondary | ICD-10-CM | POA: Diagnosis not present

## 2024-06-12 DIAGNOSIS — H40013 Open angle with borderline findings, low risk, bilateral: Secondary | ICD-10-CM | POA: Diagnosis not present

## 2024-06-19 ENCOUNTER — Other Ambulatory Visit (HOSPITAL_BASED_OUTPATIENT_CLINIC_OR_DEPARTMENT_OTHER): Payer: Self-pay

## 2024-06-19 ENCOUNTER — Telehealth: Admitting: Physician Assistant

## 2024-06-19 DIAGNOSIS — R3989 Other symptoms and signs involving the genitourinary system: Secondary | ICD-10-CM | POA: Diagnosis not present

## 2024-06-19 MED ORDER — ONDANSETRON 4 MG PO TBDP
4.0000 mg | ORAL_TABLET | Freq: Three times a day (TID) | ORAL | 0 refills | Status: DC | PRN
  Filled 2024-06-19: qty 20, 7d supply, fill #0

## 2024-06-19 MED ORDER — NITROFURANTOIN MONOHYD MACRO 100 MG PO CAPS
100.0000 mg | ORAL_CAPSULE | Freq: Two times a day (BID) | ORAL | 0 refills | Status: DC
  Filled 2024-06-19: qty 10, 5d supply, fill #0

## 2024-06-19 NOTE — Progress Notes (Signed)

## 2024-06-20 ENCOUNTER — Other Ambulatory Visit (HOSPITAL_BASED_OUTPATIENT_CLINIC_OR_DEPARTMENT_OTHER): Payer: Self-pay

## 2024-06-20 DIAGNOSIS — Z3202 Encounter for pregnancy test, result negative: Secondary | ICD-10-CM | POA: Diagnosis not present

## 2024-06-20 DIAGNOSIS — Z3046 Encounter for surveillance of implantable subdermal contraceptive: Secondary | ICD-10-CM | POA: Diagnosis not present

## 2024-06-20 DIAGNOSIS — Z30430 Encounter for insertion of intrauterine contraceptive device: Secondary | ICD-10-CM | POA: Diagnosis not present

## 2024-06-20 MED ORDER — AMLODIPINE BESYLATE 5 MG PO TABS
5.0000 mg | ORAL_TABLET | Freq: Every day | ORAL | 0 refills | Status: DC
  Filled 2024-06-20: qty 90, 90d supply, fill #0

## 2024-06-23 ENCOUNTER — Other Ambulatory Visit (HOSPITAL_BASED_OUTPATIENT_CLINIC_OR_DEPARTMENT_OTHER): Payer: Self-pay

## 2024-06-23 DIAGNOSIS — F4322 Adjustment disorder with anxiety: Secondary | ICD-10-CM | POA: Diagnosis not present

## 2024-06-23 MED ORDER — FLUZONE 0.5 ML IM SUSY
0.5000 mL | PREFILLED_SYRINGE | Freq: Once | INTRAMUSCULAR | 0 refills | Status: AC
  Filled 2024-06-23: qty 0.5, 1d supply, fill #0

## 2024-06-25 ENCOUNTER — Other Ambulatory Visit (HOSPITAL_BASED_OUTPATIENT_CLINIC_OR_DEPARTMENT_OTHER): Payer: Self-pay

## 2024-06-25 DIAGNOSIS — R319 Hematuria, unspecified: Secondary | ICD-10-CM | POA: Diagnosis not present

## 2024-06-25 DIAGNOSIS — M9903 Segmental and somatic dysfunction of lumbar region: Secondary | ICD-10-CM | POA: Diagnosis not present

## 2024-06-25 DIAGNOSIS — R10A1 Flank pain, right side: Secondary | ICD-10-CM | POA: Diagnosis not present

## 2024-06-25 DIAGNOSIS — M9906 Segmental and somatic dysfunction of lower extremity: Secondary | ICD-10-CM | POA: Diagnosis not present

## 2024-06-25 DIAGNOSIS — M9905 Segmental and somatic dysfunction of pelvic region: Secondary | ICD-10-CM | POA: Diagnosis not present

## 2024-06-25 DIAGNOSIS — M9902 Segmental and somatic dysfunction of thoracic region: Secondary | ICD-10-CM | POA: Diagnosis not present

## 2024-06-25 DIAGNOSIS — M9901 Segmental and somatic dysfunction of cervical region: Secondary | ICD-10-CM | POA: Diagnosis not present

## 2024-06-25 MED ORDER — FLUCONAZOLE 150 MG PO TABS
150.0000 mg | ORAL_TABLET | ORAL | 0 refills | Status: DC | PRN
  Filled 2024-06-25: qty 2, 6d supply, fill #0

## 2024-06-25 MED ORDER — CIPROFLOXACIN HCL 250 MG PO TABS
250.0000 mg | ORAL_TABLET | Freq: Two times a day (BID) | ORAL | 0 refills | Status: AC
  Filled 2024-06-25: qty 14, 7d supply, fill #0

## 2024-06-26 DIAGNOSIS — M25571 Pain in right ankle and joints of right foot: Secondary | ICD-10-CM | POA: Diagnosis not present

## 2024-07-04 DIAGNOSIS — S93491A Sprain of other ligament of right ankle, initial encounter: Secondary | ICD-10-CM | POA: Diagnosis not present

## 2024-07-04 DIAGNOSIS — S93492D Sprain of other ligament of left ankle, subsequent encounter: Secondary | ICD-10-CM | POA: Diagnosis not present

## 2024-07-04 DIAGNOSIS — M722 Plantar fascial fibromatosis: Secondary | ICD-10-CM | POA: Diagnosis not present

## 2024-07-04 DIAGNOSIS — S93492A Sprain of other ligament of left ankle, initial encounter: Secondary | ICD-10-CM | POA: Diagnosis not present

## 2024-07-07 DIAGNOSIS — F4322 Adjustment disorder with anxiety: Secondary | ICD-10-CM | POA: Diagnosis not present

## 2024-07-16 DIAGNOSIS — Z129 Encounter for screening for malignant neoplasm, site unspecified: Secondary | ICD-10-CM | POA: Diagnosis not present

## 2024-07-16 DIAGNOSIS — D229 Melanocytic nevi, unspecified: Secondary | ICD-10-CM | POA: Diagnosis not present

## 2024-07-16 DIAGNOSIS — D224 Melanocytic nevi of scalp and neck: Secondary | ICD-10-CM | POA: Diagnosis not present

## 2024-07-21 ENCOUNTER — Encounter: Payer: Self-pay | Admitting: Radiology

## 2024-07-21 ENCOUNTER — Other Ambulatory Visit (HOSPITAL_BASED_OUTPATIENT_CLINIC_OR_DEPARTMENT_OTHER): Payer: Self-pay

## 2024-07-21 DIAGNOSIS — N762 Acute vulvitis: Secondary | ICD-10-CM | POA: Diagnosis not present

## 2024-07-21 DIAGNOSIS — Z30431 Encounter for routine checking of intrauterine contraceptive device: Secondary | ICD-10-CM | POA: Diagnosis not present

## 2024-07-21 MED ORDER — CLOBETASOL PROPIONATE 0.05 % EX OINT
TOPICAL_OINTMENT | CUTANEOUS | 0 refills | Status: AC
  Filled 2024-07-21: qty 30, 7d supply, fill #0

## 2024-07-23 DIAGNOSIS — M9901 Segmental and somatic dysfunction of cervical region: Secondary | ICD-10-CM | POA: Diagnosis not present

## 2024-07-23 DIAGNOSIS — M9906 Segmental and somatic dysfunction of lower extremity: Secondary | ICD-10-CM | POA: Diagnosis not present

## 2024-07-23 DIAGNOSIS — M9902 Segmental and somatic dysfunction of thoracic region: Secondary | ICD-10-CM | POA: Diagnosis not present

## 2024-07-23 DIAGNOSIS — M9903 Segmental and somatic dysfunction of lumbar region: Secondary | ICD-10-CM | POA: Diagnosis not present

## 2024-07-23 DIAGNOSIS — M9905 Segmental and somatic dysfunction of pelvic region: Secondary | ICD-10-CM | POA: Diagnosis not present

## 2024-08-19 DIAGNOSIS — F4322 Adjustment disorder with anxiety: Secondary | ICD-10-CM | POA: Diagnosis not present

## 2024-08-26 ENCOUNTER — Other Ambulatory Visit (HOSPITAL_BASED_OUTPATIENT_CLINIC_OR_DEPARTMENT_OTHER): Payer: Self-pay

## 2024-08-26 MED ORDER — AMLODIPINE BESYLATE 5 MG PO TABS
5.0000 mg | ORAL_TABLET | Freq: Every day | ORAL | 3 refills | Status: AC
  Filled 2024-08-26 – 2024-09-02 (×3): qty 90, 90d supply, fill #0

## 2024-08-27 ENCOUNTER — Encounter (HOSPITAL_BASED_OUTPATIENT_CLINIC_OR_DEPARTMENT_OTHER): Payer: Self-pay | Admitting: Orthopaedic Surgery

## 2024-08-27 DIAGNOSIS — M9903 Segmental and somatic dysfunction of lumbar region: Secondary | ICD-10-CM | POA: Diagnosis not present

## 2024-08-27 DIAGNOSIS — M9906 Segmental and somatic dysfunction of lower extremity: Secondary | ICD-10-CM | POA: Diagnosis not present

## 2024-08-27 DIAGNOSIS — M9901 Segmental and somatic dysfunction of cervical region: Secondary | ICD-10-CM | POA: Diagnosis not present

## 2024-08-27 DIAGNOSIS — M9902 Segmental and somatic dysfunction of thoracic region: Secondary | ICD-10-CM | POA: Diagnosis not present

## 2024-08-27 DIAGNOSIS — M9905 Segmental and somatic dysfunction of pelvic region: Secondary | ICD-10-CM | POA: Diagnosis not present

## 2024-08-28 ENCOUNTER — Other Ambulatory Visit (HOSPITAL_BASED_OUTPATIENT_CLINIC_OR_DEPARTMENT_OTHER): Payer: Self-pay

## 2024-08-29 ENCOUNTER — Encounter (HOSPITAL_BASED_OUTPATIENT_CLINIC_OR_DEPARTMENT_OTHER)
Admission: RE | Admit: 2024-08-29 | Discharge: 2024-08-29 | Disposition: A | Source: Ambulatory Visit | Attending: Orthopaedic Surgery

## 2024-09-02 ENCOUNTER — Other Ambulatory Visit (HOSPITAL_BASED_OUTPATIENT_CLINIC_OR_DEPARTMENT_OTHER): Payer: Self-pay

## 2024-09-02 NOTE — H&P (Signed)
 ORTHOPAEDIC SURGERY H&P  Subjective:  The patient presents for right ankle instability.   Past Medical History:  Diagnosis Date   Acne    Allergic rhinitis    Closed fracture of nasal bone    Deviated nasal septum    traumatic   Glaucoma suspect    Glaucoma suspect of both eyes    Headache    Hypertension    PCOS (polycystic ovarian syndrome) 07/27/2017    Past Surgical History:  Procedure Laterality Date   ARTHROSCOPY, ANKLE WITH DEBRIDEMENT Left 12/19/2023   Procedure: ARTHROSCOPY, ANKLE WITH DEBRIDEMENT;  Surgeon: Barton Drape, MD;  Location: [facility];  Service: Orthopedics;  Laterality: Left;   LIGAMENT REPAIR Left 12/19/2023   Procedure: REPAIR, LIGAMENT;  Surgeon: Barton Drape, MD;  Location: [facility];  Service: Orthopedics;  Laterality: Left;  peroneal tendon   NASAL SEPTUM SURGERY  03/27/2018   R inferior turbinate outfracture     Show/hide medication list[1]   Allergies[2]  Social History   Socioeconomic History   Marital status: Single    Spouse name: Not on file   Number of children: 0   Years of education: Not on file   Highest education level: Not on file  Occupational History   Occupation: RN [employer]  Tobacco Use   Smoking status: Never   Smokeless tobacco: Never  Vaping Use   Vaping status: Never Used  Substance and Sexual Activity   Alcohol use: Yes    Comment: social   Drug use: No   Sexual activity: Not on file  Other Topics Concern   Not on file  Social History Narrative   RN   Mother is Mrs Sari   Lives w/ mother    Social Drivers of Health   Tobacco Use: Low Risk (08/27/2024)   Patient History    Smoking Tobacco Use: Never    Smokeless Tobacco Use: Never    Passive Exposure: Not on file  Financial Resource Strain: Not on file  Food Insecurity: Low Risk (01/28/2024)   Received from [facility]   Epic    Within the past 12 months, you worried that your food would run out before you  got money to buy more: Never true    Within the past 12 months, the food you bought just didn't last and you didn't have money to get more. : Never true  Transportation Needs: No Transportation Needs (01/28/2024)   Received from [facility]    In the past 12 months, has lack of reliable transportation kept you from medical appointments, meetings, work or from getting things needed for daily living? : No  Physical Activity: Not on file  Stress: Not on file  Social Connections: Not on file  Intimate Partner Violence: Not on file  Depression (EYV7-0): Not on file  Alcohol Screen: Not on file  Housing: Low Risk (01/28/2024)   Received from [facility]   Epic    What is your living situation today?: I have a steady place to live    Think about the place you live. Do you have problems with any of the following? Choose all that apply:: None/None on this list  Utilities: Low Risk (01/28/2024)   Received from [facility]   Utilities    In the past 12 months has the electric, gas, oil, or water company threatened to shut off services in your home? : No  Health Literacy: Not on file  History reviewed. No pertinent family history.   Review of Systems Pertinent items are noted in HPI.  Objective: Vital signs in last 24 hours:    08/27/2024   10:58 AM 12/19/2023   12:36 PM 12/19/2023   12:15 PM  Vitals with BMI  Height 5' 6    Weight 210 lbs    BMI 33.91    Systolic  146 136  Diastolic  97 97  Pulse  98 94      EXAM: General: Well nourished, well developed. Awake, alert and oriented to time, place, person. Normal mood and affect. No apparent distress. Breathing room air.  Operative Lower Extremity: Alignment - Neutral Deformity - None Skin intact Tenderness to palpation - right ankle LLC and peroneals 5/5 TA, PT, GS, Per, EHL, FHL Sensation intact to light touch throughout Palpable DP and PT pulses Special testing: None  The contralateral foot/ankle  was examined for comparison and noted to be neurovascularly intact with no localized deformity, swelling, or tenderness.  Imaging Review All images taken were independently reviewed by me.  Assessment/Plan: The clinical and radiographic findings were reviewed and discussed at length with the patient.  The patient presents for right ankle instability.  We spoke at length about the natural course of these findings. We discussed nonoperative and operative treatment options in detail.  The risks and benefits were presented and reviewed. The risks due to hardware/suture failure and/or irritation (if removing hardware: inability to remove part/all of hardware, recurrent instability), new/persistent infection, stiffness, nerve/vessel/tendon injury or rerupture of repaired tendon, nonunion/malunion, allograft usage, wound healing issues, development of arthritis, failure of this surgery, possibility of external fixation with delayed definitive surgery, need for further surgery, thromboembolic events, anesthesia/medical complications, amputation, death among others were discussed.  Karen Lang  Orthopaedic Surgery [facility]     [1] (Not in an outpatient encounter) [2]  Allergies Allergen Reactions   Metformin Hcl     Gi upset

## 2024-09-02 NOTE — Anesthesia Preprocedure Evaluation (Addendum)
 Anesthesia Evaluation  Patient identified by MRN, date of birth, ID band Patient awake    Reviewed: Allergy & Precautions, NPO status , Patient's Chart, lab work & pertinent test results  History of Anesthesia Complications Negative for: history of anesthetic complications  Airway Mallampati: I  TM Distance: >3 FB Neck ROM: Full    Dental no notable dental hx.    Pulmonary neg pulmonary ROS   Pulmonary exam normal        Cardiovascular hypertension, Pt. on medications Normal cardiovascular exam     Neuro/Psych  Headaches    GI/Hepatic negative GI ROS, Neg liver ROS,,,  Endo/Other  negative endocrine ROS    Renal/GU negative Renal ROS     Musculoskeletal negative musculoskeletal ROS (+)    Abdominal   Peds  Hematology negative hematology ROS (+)   Anesthesia Other Findings   Reproductive/Obstetrics                              Anesthesia Physical Anesthesia Plan  ASA: 2  Anesthesia Plan: General   Post-op Pain Management: Tylenol  PO (pre-op)* and Regional block*   Induction: Intravenous  PONV Risk Score and Plan: 3 and Treatment may vary due to age or medical condition, Ondansetron , Dexamethasone , Midazolam  and Scopolamine  patch - Pre-op  Airway Management Planned: LMA  Additional Equipment: None  Intra-op Plan:   Post-operative Plan: Extubation in OR  Informed Consent: I have reviewed the patients History and Physical, chart, labs and discussed the procedure including the risks, benefits and alternatives for the proposed anesthesia with the patient or authorized representative who has indicated his/her understanding and acceptance.     Dental advisory given  Plan Discussed with: CRNA  Anesthesia Plan Comments:          Anesthesia Quick Evaluation

## 2024-09-02 NOTE — Discharge Instructions (Signed)
 Lillia Mountain, MD [facility]  Please read the following information regarding your care after surgery.  Medications   - Oxycodone  5 mg every 6 hours as needed for pain - Aspirin  81 mg twice daily as scheduled to prevent blood clots - Colace 100 mg twice daily as needed for constipation - Zofran  4 mg every 8 hours as needed for nausea/vomiting  We send above prescriptions to your pharmacy on file.  In addition you may also use: ? acetaminophen  (Tylenol ) 500 mg every 4-6 hours as you need for minor to moderate pain  Resume all other routine medications per usual or as directed by your PCP/other specialists.  ? To help prevent blood clots, you should also get up every hour while you are awake to move around.  Weight Bearing ? OK to stand and be touchdown weightbearing on the operative leg only AFTER the nerve block has completely worn off. Otherwise keep off the leg as much as possible  Cast / Splint / Dressing ? Keep your dressing clean and dry.  Dont put anything (coat hanger, pencil, etc) down inside of it.  If it gets wet, please notify the office immediately.  Swelling IMPORTANT: It is normal for you to have swelling where you had surgery. To reduce swelling and pain, keep at least 3 pillows under your leg so that your toes are above your nose and your heel is above the level of your hip.  It may be necessary to keep your foot or leg elevated for several weeks.  This is critical to helping your incisions heal and your pain to feel better.  Follow Up Call my office at [phone] when you are discharged from the hospital or surgery center to schedule an appointment to be seen within 7-10 days after surgery.  Call my office at [phone] if you develop a fever >101.5 F, nausea, vomiting, bleeding from the surgical site or severe pain.       Post Anesthesia Home Care Instructions  Activity: Get plenty of rest for the remainder of the day. A responsible individual must  stay with you for 24 hours following the procedure.  For the next 24 hours, DO NOT: -Drive a car -Advertising copywriter -Drink alcoholic beverages -Take any medication unless instructed by your physician -Make any legal decisions or sign important papers.  Meals: Start with liquid foods such as gelatin or soup. Progress to regular foods as tolerated. Avoid greasy, spicy, heavy foods. If nausea and/or vomiting occur, drink only clear liquids until the nausea and/or vomiting subsides. Call your physician if vomiting continues.  Special Instructions/Symptoms: Your throat may feel dry or sore from the anesthesia or the breathing tube placed in your throat during surgery. If this causes discomfort, gargle with warm salt water. The discomfort should disappear within 24 hours.  If you had a scopolamine  patch placed behind your ear for the management of post- operative nausea and/or vomiting:  1. The medication in the patch is effective for 72 hours, after which it should be removed.  Wrap patch in a tissue and discard in the trash. Wash hands thoroughly with soap and water. 2. You may remove the patch earlier than 72 hours if you experience unpleasant side effects which may include dry mouth, dizziness or visual disturbances. 3. Avoid touching the patch. Wash your hands with soap and water after contact with the patch.      Regional Anesthesia Blocks  1. You may not be able to move or feel the blocked extremity  after a regional anesthetic block. This may last may last from 3-48 hours after placement, but it will go away. The length of time depends on the medication injected and your individual response to the medication. As the nerves start to wake up, you may experience tingling as the movement and feeling returns to your extremity. If the numbness and inability to move your extremity has not gone away after 48 hours, please call your surgeon.   2. The extremity that is blocked will need to be  protected until the numbness is gone and the strength has returned. Because you cannot feel it, you will need to take extra care to avoid injury. Because it may be weak, you may have difficulty moving it or using it. You may not know what position it is in without looking at it while the block is in effect.  3. For blocks in the legs and feet, returning to weight bearing and walking needs to be done carefully. You will need to wait until the numbness is entirely gone and the strength has returned. You should be able to move your leg and foot normally before you try and bear weight or walk. You will need someone to be with you when you first try to ensure you do not fall and possibly risk injury.  4. Bruising and tenderness at the needle site are common side effects and will resolve in a few days.  5. Persistent numbness or new problems with movement should be communicated to the surgeon or the [facility] [phone] [facility] [phone]).   Next dose of Tylenol  may be given at 1:00pm if needed.

## 2024-09-03 ENCOUNTER — Ambulatory Visit (HOSPITAL_BASED_OUTPATIENT_CLINIC_OR_DEPARTMENT_OTHER)
Admission: RE | Admit: 2024-09-03 | Discharge: 2024-09-03 | Disposition: A | Source: Home / Self Care | Attending: Orthopaedic Surgery | Admitting: Orthopaedic Surgery

## 2024-09-03 ENCOUNTER — Encounter (HOSPITAL_BASED_OUTPATIENT_CLINIC_OR_DEPARTMENT_OTHER): Payer: Self-pay | Admitting: Anesthesiology

## 2024-09-03 ENCOUNTER — Ambulatory Visit (HOSPITAL_BASED_OUTPATIENT_CLINIC_OR_DEPARTMENT_OTHER): Payer: Self-pay | Admitting: Anesthesiology

## 2024-09-03 ENCOUNTER — Encounter (HOSPITAL_BASED_OUTPATIENT_CLINIC_OR_DEPARTMENT_OTHER): Payer: Self-pay | Admitting: Orthopaedic Surgery

## 2024-09-03 ENCOUNTER — Other Ambulatory Visit (HOSPITAL_BASED_OUTPATIENT_CLINIC_OR_DEPARTMENT_OTHER): Payer: Self-pay

## 2024-09-03 ENCOUNTER — Ambulatory Visit (HOSPITAL_BASED_OUTPATIENT_CLINIC_OR_DEPARTMENT_OTHER)

## 2024-09-03 ENCOUNTER — Encounter (HOSPITAL_BASED_OUTPATIENT_CLINIC_OR_DEPARTMENT_OTHER): Admission: RE | Disposition: A | Payer: Self-pay | Source: Home / Self Care | Attending: Orthopaedic Surgery

## 2024-09-03 ENCOUNTER — Other Ambulatory Visit: Payer: Self-pay

## 2024-09-03 DIAGNOSIS — Z01818 Encounter for other preprocedural examination: Secondary | ICD-10-CM

## 2024-09-03 DIAGNOSIS — G8918 Other acute postprocedural pain: Secondary | ICD-10-CM | POA: Diagnosis not present

## 2024-09-03 DIAGNOSIS — M7671 Peroneal tendinitis, right leg: Secondary | ICD-10-CM

## 2024-09-03 DIAGNOSIS — I10 Essential (primary) hypertension: Secondary | ICD-10-CM | POA: Insufficient documentation

## 2024-09-03 DIAGNOSIS — M25371 Other instability, right ankle: Secondary | ICD-10-CM

## 2024-09-03 DIAGNOSIS — S93491A Sprain of other ligament of right ankle, initial encounter: Secondary | ICD-10-CM | POA: Diagnosis not present

## 2024-09-03 HISTORY — PX: ARTHROSCOPY, ANKLE WITH DEBRIDEMENT: SHX7318

## 2024-09-03 HISTORY — PX: TENDON TRANSFER: SHX6109

## 2024-09-03 HISTORY — PX: LIGAMENT REPAIR: SHX5444

## 2024-09-03 LAB — POCT PREGNANCY, URINE: Preg Test, Ur: NEGATIVE

## 2024-09-03 SURGERY — ARTHROSCOPY, ANKLE WITH DEBRIDEMENT
Anesthesia: General | Site: Ankle | Laterality: Right

## 2024-09-03 MED ORDER — FENTANYL CITRATE (PF) 100 MCG/2ML IJ SOLN
INTRAMUSCULAR | Status: AC
  Filled 2024-09-03: qty 2

## 2024-09-03 MED ORDER — DEXAMETHASONE SODIUM PHOSPHATE 4 MG/ML IJ SOLN
INTRAMUSCULAR | Status: DC | PRN
  Administered 2024-09-03: 09:00:00 5 mg via INTRAVENOUS

## 2024-09-03 MED ORDER — SCOPOLAMINE 1 MG/3DAYS TD PT72
1.0000 | MEDICATED_PATCH | TRANSDERMAL | Status: DC
  Administered 2024-09-03: 07:00:00 1 mg via TRANSDERMAL

## 2024-09-03 MED ORDER — FENTANYL CITRATE (PF) 100 MCG/2ML IJ SOLN
INTRAMUSCULAR | Status: DC | PRN
  Administered 2024-09-03 (×2): 50 ug via INTRAVENOUS

## 2024-09-03 MED ORDER — OXYCODONE HCL 5 MG PO TABS: 5.0000 mg | ORAL_TABLET | Freq: Once | ORAL | Status: DC | PRN

## 2024-09-03 MED ORDER — CEFAZOLIN SODIUM-DEXTROSE 2-4 GM/100ML-% IV SOLN
2.0000 g | INTRAVENOUS | Status: AC
  Administered 2024-09-03: 09:00:00 2 g via INTRAVENOUS

## 2024-09-03 MED ORDER — PROPOFOL 500 MG/50ML IV EMUL
INTRAVENOUS | Status: AC
  Filled 2024-09-03: qty 200

## 2024-09-03 MED ORDER — MIDAZOLAM HCL 2 MG/2ML IJ SOLN
INTRAMUSCULAR | Status: AC
  Filled 2024-09-03: qty 2

## 2024-09-03 MED ORDER — VANCOMYCIN HCL 500 MG IV SOLR
INTRAVENOUS | Status: AC
  Filled 2024-09-03: qty 10

## 2024-09-03 MED ORDER — DEXMEDETOMIDINE HCL IN NACL 80 MCG/20ML IV SOLN
INTRAVENOUS | Status: DC | PRN
  Administered 2024-09-03: 09:00:00 8 ug via INTRAVENOUS

## 2024-09-03 MED ORDER — ASPIRIN 81 MG PO TBEC
81.0000 mg | DELAYED_RELEASE_TABLET | Freq: Two times a day (BID) | ORAL | 0 refills | Status: AC
  Filled 2024-09-03: qty 84, 42d supply, fill #0

## 2024-09-03 MED ORDER — ASPIRIN 81 MG PO TBEC: 81.0000 mg | DELAYED_RELEASE_TABLET | Freq: Two times a day (BID) | ORAL | 0 refills | Status: AC

## 2024-09-03 MED ORDER — FENTANYL CITRATE (PF) 100 MCG/2ML IJ SOLN
25.0000 ug | INTRAMUSCULAR | Status: DC | PRN
  Administered 2024-09-03: 10:00:00 50 ug via INTRAVENOUS

## 2024-09-03 MED ORDER — DEXMEDETOMIDINE HCL IN NACL 80 MCG/20ML IV SOLN
INTRAVENOUS | Status: AC
  Filled 2024-09-03: qty 20

## 2024-09-03 MED ORDER — PHENYLEPHRINE HCL (PRESSORS) 10 MG/ML IV SOLN
INTRAVENOUS | Status: DC | PRN
  Administered 2024-09-03: 09:00:00 160 ug via INTRAVENOUS
  Administered 2024-09-03: 10:00:00 80 ug via INTRAVENOUS

## 2024-09-03 MED ORDER — FENTANYL CITRATE (PF) 100 MCG/2ML IJ SOLN
50.0000 ug | Freq: Once | INTRAMUSCULAR | Status: AC
  Administered 2024-09-03: 08:00:00 50 ug via INTRAVENOUS

## 2024-09-03 MED ORDER — CHLORHEXIDINE GLUCONATE 4 % EX SOLN: 60.0000 mL | Freq: Once | CUTANEOUS | Status: DC

## 2024-09-03 MED ORDER — CLONIDINE HCL (ANALGESIA) 100 MCG/ML EP SOLN
EPIDURAL | Status: DC | PRN
  Administered 2024-09-03: 08:00:00 67 ug
  Administered 2024-09-03: 08:00:00 33 ug

## 2024-09-03 MED ORDER — DOCUSATE SODIUM 100 MG PO CAPS
100.0000 mg | ORAL_CAPSULE | Freq: Two times a day (BID) | ORAL | 0 refills | Status: AC
  Filled 2024-09-03: qty 100, 50d supply, fill #0

## 2024-09-03 MED ORDER — LIDOCAINE HCL (CARDIAC) PF 100 MG/5ML IV SOSY
PREFILLED_SYRINGE | INTRAVENOUS | Status: DC | PRN
  Administered 2024-09-03: 09:00:00 100 mg via INTRAVENOUS

## 2024-09-03 MED ORDER — DOCUSATE SODIUM 100 MG PO CAPS: 100.0000 mg | ORAL_CAPSULE | Freq: Two times a day (BID) | ORAL | 0 refills | Status: AC

## 2024-09-03 MED ORDER — SCOPOLAMINE 1 MG/3DAYS TD PT72
MEDICATED_PATCH | TRANSDERMAL | Status: AC
  Filled 2024-09-03: qty 1

## 2024-09-03 MED ORDER — BUPIVACAINE-EPINEPHRINE (PF) 0.5% -1:200000 IJ SOLN
INTRAMUSCULAR | Status: DC | PRN
  Administered 2024-09-03: 08:00:00 20 mL via PERINEURAL
  Administered 2024-09-03: 08:00:00 10 mL via PERINEURAL

## 2024-09-03 MED ORDER — MIDAZOLAM HCL (PF) 2 MG/2ML IJ SOLN
2.0000 mg | Freq: Once | INTRAMUSCULAR | Status: AC
  Administered 2024-09-03: 08:00:00 2 mg via INTRAVENOUS

## 2024-09-03 MED ORDER — DROPERIDOL 2.5 MG/ML IJ SOLN: 0.6250 mg | Freq: Once | INTRAMUSCULAR | Status: DC | PRN

## 2024-09-03 MED ORDER — LACTATED RINGERS IV SOLN: INTRAVENOUS | Status: DC

## 2024-09-03 MED ORDER — OXYCODONE HCL 5 MG PO TABS
5.0000 mg | ORAL_TABLET | ORAL | 0 refills | Status: AC | PRN
  Filled 2024-09-03: qty 42, 7d supply, fill #0

## 2024-09-03 MED ORDER — ONDANSETRON HCL 4 MG/2ML IJ SOLN
INTRAMUSCULAR | Status: DC | PRN
  Administered 2024-09-03: 10:00:00 4 mg via INTRAVENOUS

## 2024-09-03 MED ORDER — ACETAMINOPHEN 500 MG PO TABS
1000.0000 mg | ORAL_TABLET | Freq: Once | ORAL | Status: AC
  Administered 2024-09-03: 07:00:00 1000 mg via ORAL

## 2024-09-03 MED ORDER — OXYCODONE HCL 5 MG PO TABS: 5.0000 mg | ORAL_TABLET | ORAL | 0 refills | Status: AC | PRN

## 2024-09-03 MED ORDER — OXYCODONE HCL 5 MG/5ML PO SOLN: 5.0000 mg | Freq: Once | ORAL | Status: DC | PRN

## 2024-09-03 MED ORDER — PROPOFOL 10 MG/ML IV BOLUS
INTRAVENOUS | Status: DC | PRN
  Administered 2024-09-03: 09:00:00 200 mg via INTRAVENOUS

## 2024-09-03 MED ORDER — BUPIVACAINE-EPINEPHRINE (PF) 0.5% -1:200000 IJ SOLN
INTRAMUSCULAR | Status: AC
  Filled 2024-09-03: qty 30

## 2024-09-03 MED ORDER — ONDANSETRON 4 MG PO TBDP
4.0000 mg | ORAL_TABLET | Freq: Three times a day (TID) | ORAL | 0 refills | Status: AC | PRN
  Filled 2024-09-03: qty 15, 5d supply, fill #0

## 2024-09-03 MED FILL — Cefazolin Sodium-Dextrose IV Solution 2 GM/100ML-4%: INTRAVENOUS | Qty: 100 | Status: AC

## 2024-09-03 MED FILL — Fentanyl Citrate Preservative Free (PF) Inj 100 MCG/2ML: INTRAMUSCULAR | Qty: 2 | Status: AC

## 2024-09-03 MED FILL — Acetaminophen Tab 500 MG: ORAL | Qty: 2 | Status: AC

## 2024-09-03 SURGICAL SUPPLY — 52 items
ANCHOR SUT FBRTK 1.3 SUTTAP (Anchor) IMPLANT
BLADE SURG 15 STRL LF DISP TIS (BLADE) ×2 IMPLANT
BNDG COMPR ESMARK 6X3 LF (GAUZE/BANDAGES/DRESSINGS) IMPLANT
BNDG ELASTIC 4INX 5YD STR LF (GAUZE/BANDAGES/DRESSINGS) ×2 IMPLANT
BNDG ELASTIC 6X10 VLCR STRL LF (GAUZE/BANDAGES/DRESSINGS) ×2 IMPLANT
BNDG STRETCH GAUZE 3IN X12FT (GAUZE/BANDAGES/DRESSINGS) ×2 IMPLANT
BOOT STEPPER DURA LG (SOFTGOODS) IMPLANT
BOOT STEPPER DURA MED (SOFTGOODS) IMPLANT
BOOT STEPPER DURA SM (SOFTGOODS) IMPLANT
BURR OVAL 8 FLU 4.0X13 (MISCELLANEOUS) IMPLANT
CHLORAPREP W/TINT 26 (MISCELLANEOUS) IMPLANT
CUFF TRNQT CYL 34X4.125X (TOURNIQUET CUFF) IMPLANT
DISSECTOR 3.8MM X 13CM (MISCELLANEOUS) IMPLANT
DRAPE EXTREMITY T 121X128X90 (DISPOSABLE) ×2 IMPLANT
DRAPE OEC MINIVIEW 54X84 (DRAPES) IMPLANT
DRAPE U-SHAPE 47X51 STRL (DRAPES) ×2 IMPLANT
DRSG MEPITEL 4X7.2 (GAUZE/BANDAGES/DRESSINGS) ×2 IMPLANT
ELECTRODE REM PT RTRN 9FT ADLT (ELECTROSURGICAL) ×2 IMPLANT
EXCALIBUR 3.8MM X 13CM (MISCELLANEOUS) IMPLANT
GAUZE PAD ABD 8X10 STRL (GAUZE/BANDAGES/DRESSINGS) IMPLANT
GAUZE SPONGE 4X4 12PLY STRL (GAUZE/BANDAGES/DRESSINGS) ×2 IMPLANT
GLOVE BIOGEL PI IND STRL 8 (GLOVE) ×2 IMPLANT
GLOVE SURG SS PI 7.5 STRL IVOR (GLOVE) ×2 IMPLANT
GOWN STRL REUS W/ TWL LRG LVL3 (GOWN DISPOSABLE) ×2 IMPLANT
KIT FIBERTAK DX 1.6 DISP (KITS) IMPLANT
NDL SUT 6 .5 CRC .975X.05 MAYO (NEEDLE) IMPLANT
PACK ARTHROSCOPY DSU (CUSTOM PROCEDURE TRAY) ×2 IMPLANT
PACK BASIN DAY SURGERY FS (CUSTOM PROCEDURE TRAY) ×2 IMPLANT
PENCIL SMOKE EVACUATOR (MISCELLANEOUS) IMPLANT
SANITIZER HAND ALTRA PUMP 550 (MISCELLANEOUS) ×2 IMPLANT
SCOPE NANONDL 125 (MISCELLANEOUS) IMPLANT
SCOPE NANONEEDLE 125 (MISCELLANEOUS) IMPLANT
SET IRRIG Y-TYPE CYSTO (SET/KITS/TRAYS/PACK) ×2 IMPLANT
SHAVER DISSECTOR 3.0 (BURR) IMPLANT
SHAVER SABRE 2.0 (BURR) IMPLANT
SHEATH NANONDL HIGHFLOW 125 (SHEATH) IMPLANT
SHEATH NANONEEDLE HIGHFLOW 125 (SHEATH) IMPLANT
SLEEVE SCD COMPRESS KNEE MED (STOCKING) ×2 IMPLANT
SOL .9 NS 3000ML IRR UROMATIC (IV SOLUTION) ×2 IMPLANT
SOLN 0.9% NACL POUR BTL 1000ML (IV SOLUTION) IMPLANT
SPLINT PLASTER CAST FAST 5X30 (CAST SUPPLIES) IMPLANT
SPONGE T-LAP 18X18 ~~LOC~~+RFID (SPONGE) ×2 IMPLANT
STRAP ANKLE FOOT DISTRACTOR (ORTHOPEDIC SUPPLIES) IMPLANT
SUCTION TUBE FRAZIER 10FR DISP (SUCTIONS) ×2 IMPLANT
SUT ETHILON 2 0 FS 18 (SUTURE) ×2 IMPLANT
SUT VIC AB 2-0 CT1 TAPERPNT 27 (SUTURE) IMPLANT
SUT VIC AB 3-0 SH 27X BRD (SUTURE) IMPLANT
SYR BULB EAR ULCER 3OZ GRN STR (SYRINGE) ×2 IMPLANT
TOWEL GREEN STERILE FF (TOWEL DISPOSABLE) ×2 IMPLANT
TUBE CONNECTING 20X1/4 (TUBING) IMPLANT
TUBING ARTHROSCOPY IRRIG 16FT (MISCELLANEOUS) ×2 IMPLANT
WAND ABLATOR APOLLO I90 (BUR) IMPLANT

## 2024-09-03 NOTE — Anesthesia Postprocedure Evaluation (Signed)
 Anesthesia Post Note  Patient: Karen Lang  Procedure(s) Performed: ARTHROSCOPY, ANKLE WITH DEBRIDEMENT (Right: Ankle) REPAIR, LIGAMENT (Right) TRANSFER, TENDON (Right)     Patient location during evaluation: PACU Anesthesia Type: General Level of consciousness: awake and alert Pain management: pain level controlled Vital Signs Assessment: post-procedure vital signs reviewed and stable Respiratory status: spontaneous breathing, nonlabored ventilation and respiratory function stable Cardiovascular status: blood pressure returned to baseline Postop Assessment: no apparent nausea or vomiting Anesthetic complications: no   No notable events documented.  Last Vitals:  Vitals:   09/03/24 1015 09/03/24 1024  BP: 121/79   Pulse: (!) 104 (!) 101  Resp: 16 14  Temp:    SpO2: 93% 94%    Last Pain:  Vitals:   09/03/24 1024  TempSrc:   PainSc: 2                  Vertell Row

## 2024-09-03 NOTE — H&P (Signed)
 H&P Update:  -History and Physical Reviewed  -Patient has been re-examined  -No change in the plan of care  -The risks and benefits were presented and reviewed. The risks due to hardware/suture failure and/or irritation (if removing hardware: inability to remove part/all of hardware, recurrent instability), new/persistent infection, stiffness, nerve/vessel/tendon injury or rerupture of repaired tendon, nonunion/malunion, allograft usage, wound healing issues, development of arthritis, failure of this surgery, possibility of external fixation with delayed definitive surgery, need for further surgery, thromboembolic events, anesthesia/medical complications, amputation, death among others were discussed. The patient acknowledged the explanation, agreed to proceed with the plan and a consent was signed.  Lillia Mountain

## 2024-09-03 NOTE — Op Note (Signed)
 09/03/2024  2:15 PM   PATIENT: Karen Lang  33 y.o. female  MRN: [id number]   PRE-OPERATIVE DIAGNOSIS:   1] Right lateral ankle instability 2] Right ankle peroneal tendinopathy   POST-OPERATIVE DIAGNOSIS:   Same   PROCEDURE: 1] Right ankle arthroscopic assisted debridement 2] Right open lateral ankle ligament stabilization [place]) 3] Right ankle peroneus brevis tendon debridement and primary repair 4] Right ankle peroneus longus tendon debridement and tenosynovectomy   SURGEON:  Lillia Mountain, MD   ASSISTANT: None   ANESTHESIA: General, regional   EBL: Minimal   TOURNIQUET:    Total Tourniquet Time Documented: Thigh (Right) - 35 minutes Total: Thigh (Right) - 35 minutes    COMPLICATIONS: None apparent   DISPOSITION: Extubated, awake and stable to recovery.   INDICATION FOR PROCEDURE: The patient presented with above diagnosis.  We discussed the diagnosis, alternative treatment options, risks and benefits of the above surgical intervention, as well as alternative non-operative treatments. All questions/concerns were addressed and the patient/family demonstrated appropriate understanding of the diagnosis, the procedure, the postoperative course, and overall prognosis. The patient wished to proceed with surgical intervention and signed an informed surgical consent as such, in each others presence prior to surgery.   PROCEDURE IN DETAIL: After preoperative consent was obtained and the correct operative site was identified, the patient was brought to the operating room supine on stretcher. General anesthesia was induced. Preoperative antibiotics were administered. Surgical timeout was taken. The patient was then positioned supine. The operative lower extremity was prepped and draped in standard sterile fashion with a tourniquet around the thigh. The extremity was exsanguinated and the tourniquet was inflated to 275 mmHg.  Routine evaluation of the  ankle joint demonstrated gross lateral laxity with drawer testing. Full dorsiflexion as well as plantarflexion was possible.   We began by insufflating the ankle joint thru anteromedial approach. The anteromedial portal was carefully established medial to the tibialis anterior tendon. The arthroscopic trochar with blunt was inserted and then camera placed. There was excellent visualization of the joint and routine diagnostic ankle arthroscopy was performed. Of note, there was mild synovitis throughout the joint noted. Mild chondral changes in the tibiotalar joint surfaces. No loose bodies were encountered and no anterior ankle impingement was identified on max dorsiflexion. The deltoid and syndesmosis ligaments were stressed and noted to be stable. Arthroscopic assisted debridement of the ankle joint was performed.    A standard curvilinear approach was made over the lateral ankle ligament complex and the distal lateral malleolus.    We began by windowing the approach posteriorly to access the peroneal tendons. The sheath was incised and tenosynovial fluid was readily evacuated. We then sequentially evaluated both the peroneus longus and brevis tendons with tearing noted in latter that was repaired primarily. We did note extensive tenosynovitis that was debrided thoroughly. There was no instability of peroneal tendons to intraoperative stress testing. The peroneal tendon sheath was closed with vicryl.    Dissection was the carried down to the level of the lateral ankle ligament complex. We sharply incised the capsule and the complex just distal to the tip of the fibula leaving a small cuff of tissue for repair after advancement. The proximal flap was elevated carefully off the lateral malleolus. We then used a rongeur to roughen the distal lateral malleolus. Two Arthrex FiberTak anchors were implanted using standard technique in the anatomic footprints of the ATFL and CFL ligaments. These were verified by  manual stress to be well seated within bone.  The suture needles were sequentially passed through the distal flap, tied, and then brought back proximally into the proximal flap were they were then tied.The foot was held in eversion throughout to set appropriate tension and protect the repair. Intraoperative ankle testing demonstrated improved stability of the newly reconstructed lateral ankle ligament complex.   The surgical sites were thoroughly irrigated. The tourniquet was deflated and hemostasis achieved. Betadine  and vancomycin  powder were applied. The deep layers were closed using 2-0 vicryl. The skin was closed without tension using 2-0 nylon suture.    The leg was cleaned with saline and sterile dressings with gauze were applied. A well padded CAM boot was applied. The patient was awakened from anesthesia and transported to the recovery room in stable condition.    FOLLOW UP PLAN: -transfer to PACU, then home -strict TDWB operative extremity, maximum elevation -maintain dry dressings until follow up -DVT ppx: Aspirin  81 mg twice daily -follow up as outpatient within 7-10 days for wound check -sutures out in 2-3 weeks in outpatient office   RADIOGRAPHS: None   Lillia Mountain [facility]

## 2024-09-03 NOTE — Progress Notes (Signed)
Assisted Dr. Howze with right, adductor canal, popliteal, ultrasound guided block. Side rails up, monitors on throughout procedure. See vital signs in flow sheet. Tolerated Procedure well. 

## 2024-09-03 NOTE — Anesthesia Procedure Notes (Signed)
 Anesthesia Regional Block: Popliteal block   Pre-Anesthetic Checklist: , timeout performed,  Correct Patient, Correct Site, Correct Laterality,  Correct Procedure, Correct Position, site marked,  Risks and benefits discussed,  Pre-op evaluation,  At surgeon's request and post-op pain management  Laterality: Right  Prep: Maximum Sterile Barrier Precautions used, chloraprep       Needles:  Injection technique: Single-shot  Needle Type: Echogenic Stimulator Needle     Needle Length: 9cm  Needle Gauge: 22     Additional Needles:   Procedures:,,,, ultrasound used (permanent image in chart),,    Narrative:  Start time: 09/03/2024 8:07 AM End time: 09/03/2024 8:10 AM Injection made incrementally with aspirations every 5 mL.  Performed by: Personally  Anesthesiologist: Paul Lamarr BRAVO, MD  Additional Notes: Risks, benefits, and alternative discussed. Patient gave consent for procedure. Patient prepped and draped in sterile fashion. Sedation administered, patient remains easily responsive to voice. Relevant anatomy identified with ultrasound guidance. Local anesthetic given in 5cc increments with no signs or symptoms of intravascular injection. No pain or paraesthesias with injection. Patient monitored throughout procedure with no signs of LAST or immediate complications. Tolerated well. Ultrasound image placed in chart.  LANEY Paul, MD

## 2024-09-03 NOTE — Transfer of Care (Signed)
 Immediate Anesthesia Transfer of Care Note  Patient: Karen Lang  Procedure(s) Performed: ARTHROSCOPY, ANKLE WITH DEBRIDEMENT (Right: Ankle) REPAIR, LIGAMENT (Right) TRANSFER, TENDON (Right)  Patient Location: PACU  Anesthesia Type:General and Regional  Level of Consciousness: drowsy and patient cooperative  Airway & Oxygen Therapy: Patient Spontanous Breathing and Patient connected to nasal cannula oxygen  Post-op Assessment: Report given to RN and Post -op Vital signs reviewed and stable  Post vital signs: Reviewed and stable  Last Vitals:  Vitals Value Taken Time  BP    Temp    Pulse 119 09/03/24 09:52  Resp 18 09/03/24 09:52  SpO2 96 % 09/03/24 09:52  Vitals shown include unfiled device data.  Last Pain:  Vitals:   09/03/24 0654  TempSrc: Temporal  PainSc: 0-No pain      Patients Stated Pain Goal: 3 (09/03/24 0654)  Complications: No notable events documented.

## 2024-09-03 NOTE — Anesthesia Procedure Notes (Signed)
 Anesthesia Regional Block: Adductor canal block   Pre-Anesthetic Checklist: , timeout performed,  Correct Patient, Correct Site, Correct Laterality,  Correct Procedure, Correct Position, site marked,  Risks and benefits discussed,  Pre-op evaluation,  At surgeon's request and post-op pain management  Laterality: Right  Prep: Maximum Sterile Barrier Precautions used, chloraprep       Needles:  Injection technique: Single-shot  Needle Type: Echogenic Stimulator Needle     Needle Length: 9cm  Needle Gauge: 22     Additional Needles:   Procedures:,,,, ultrasound used (permanent image in chart),,    Narrative:  Start time: 09/03/2024 8:04 AM End time: 09/03/2024 8:07 AM Injection made incrementally with aspirations every 5 mL.  Performed by: Personally  Anesthesiologist: Paul Lamarr BRAVO, MD  Additional Notes: Risks, benefits, and alternative discussed. Patient gave consent for procedure. Patient prepped and draped in sterile fashion. Sedation administered, patient remains easily responsive to voice. Relevant anatomy identified with ultrasound guidance. Local anesthetic given in 5cc increments with no signs or symptoms of intravascular injection. No pain or paraesthesias with injection. Patient monitored throughout procedure with no signs of LAST or immediate complications. Tolerated well. Ultrasound image placed in chart.  LANEY Paul, MD

## 2024-09-03 NOTE — Anesthesia Procedure Notes (Signed)
 Procedure Name: LMA Insertion Date/Time: 09/03/2024 8:49 AM  Performed by: Donnell Berwyn SQUIBB, CRNAPre-anesthesia Checklist: Patient identified, Emergency Drugs available, Suction available, Patient being monitored and Timeout performed Patient Re-evaluated:Patient Re-evaluated prior to induction Oxygen Delivery Method: Circle system utilized Preoxygenation: Pre-oxygenation with 100% oxygen Induction Type: IV induction Ventilation: Mask ventilation without difficulty LMA: LMA inserted LMA Size: 4.0 Number of attempts: 1 Placement Confirmation: positive ETCO2 and breath sounds checked- equal and bilateral Tube secured with: Tape Dental Injury: Teeth and Oropharynx as per pre-operative assessment

## 2024-09-04 ENCOUNTER — Encounter (HOSPITAL_BASED_OUTPATIENT_CLINIC_OR_DEPARTMENT_OTHER): Payer: Self-pay | Admitting: Orthopaedic Surgery

## 2024-09-09 ENCOUNTER — Encounter (HOSPITAL_BASED_OUTPATIENT_CLINIC_OR_DEPARTMENT_OTHER): Payer: Self-pay | Admitting: Orthopaedic Surgery

## 2024-09-16 DIAGNOSIS — S93491D Sprain of other ligament of right ankle, subsequent encounter: Secondary | ICD-10-CM | POA: Diagnosis not present
